# Patient Record
Sex: Female | Born: 1965 | Race: White | Hispanic: No | Marital: Married | Smoking: Former smoker
Health system: Southern US, Community
[De-identification: ages and names within clinical notes are randomized; demographics above are authoritative.]

## PROBLEM LIST (undated history)

## (undated) DIAGNOSIS — T8859XA Other complications of anesthesia, initial encounter: Secondary | ICD-10-CM

## (undated) DIAGNOSIS — L509 Urticaria, unspecified: Secondary | ICD-10-CM

## (undated) DIAGNOSIS — Z9889 Other specified postprocedural states: Secondary | ICD-10-CM

## (undated) DIAGNOSIS — F419 Anxiety disorder, unspecified: Secondary | ICD-10-CM

## (undated) DIAGNOSIS — T4145XA Adverse effect of unspecified anesthetic, initial encounter: Secondary | ICD-10-CM

## (undated) DIAGNOSIS — T7840XA Allergy, unspecified, initial encounter: Secondary | ICD-10-CM

## (undated) DIAGNOSIS — R112 Nausea with vomiting, unspecified: Secondary | ICD-10-CM

## (undated) HISTORY — PX: BIOPSY THYROID: PRO38

## (undated) HISTORY — PX: APPENDECTOMY: SHX54

## (undated) HISTORY — PX: ABDOMINAL HYSTERECTOMY: SHX81

---

## 1997-08-04 ENCOUNTER — Other Ambulatory Visit: Admission: RE | Admit: 1997-08-04 | Discharge: 1997-08-04 | Payer: Self-pay | Admitting: Obstetrics and Gynecology

## 1998-08-20 ENCOUNTER — Other Ambulatory Visit: Admission: RE | Admit: 1998-08-20 | Discharge: 1998-08-20 | Payer: Self-pay | Admitting: Obstetrics and Gynecology

## 2004-02-03 ENCOUNTER — Emergency Department (HOSPITAL_COMMUNITY): Admission: EM | Admit: 2004-02-03 | Discharge: 2004-02-03 | Payer: Self-pay | Admitting: Emergency Medicine

## 2011-01-13 ENCOUNTER — Emergency Department (HOSPITAL_COMMUNITY)
Admission: EM | Admit: 2011-01-13 | Discharge: 2011-01-13 | Disposition: A | Discharge status: Home / Self Care | Payer: Managed Care, Other (non HMO) | Attending: Emergency Medicine | Admitting: Emergency Medicine

## 2011-01-13 ENCOUNTER — Other Ambulatory Visit: Payer: Self-pay | Admitting: Obstetrics and Gynecology

## 2011-01-13 DIAGNOSIS — Z1231 Encounter for screening mammogram for malignant neoplasm of breast: Secondary | ICD-10-CM

## 2011-01-13 DIAGNOSIS — N898 Other specified noninflammatory disorders of vagina: Secondary | ICD-10-CM | POA: Insufficient documentation

## 2011-01-13 DIAGNOSIS — R109 Unspecified abdominal pain: Secondary | ICD-10-CM | POA: Insufficient documentation

## 2011-01-13 LAB — URINALYSIS, ROUTINE W REFLEX MICROSCOPIC
Bilirubin Urine: NEGATIVE
Glucose, UA: NEGATIVE mg/dL
Ketones, ur: NEGATIVE mg/dL
Leukocytes, UA: NEGATIVE
Nitrite: NEGATIVE
Protein, ur: NEGATIVE mg/dL
Specific Gravity, Urine: 1.023 (ref 1.005–1.030)
Urobilinogen, UA: 0.2 mg/dL (ref 0.0–1.0)
pH: 5.5 (ref 5.0–8.0)

## 2011-01-13 LAB — WET PREP, GENITAL
Clue Cells Wet Prep HPF POC: NONE SEEN
Trich, Wet Prep: NONE SEEN
Yeast Wet Prep HPF POC: NONE SEEN

## 2011-01-13 LAB — BASIC METABOLIC PANEL
BUN: 15 mg/dL (ref 6–23)
CO2: 25 mEq/L (ref 19–32)
Calcium: 8.8 mg/dL (ref 8.4–10.5)
Chloride: 104 mEq/L (ref 96–112)
Creatinine, Ser: 0.59 mg/dL (ref 0.50–1.10)
GFR calc Af Amer: >90 mL/min (ref >90)
GFR calc non Af Amer: >90 mL/min (ref >90)
Glucose, Bld: 107 mg/dL — ABNORMAL HIGH (ref 70–99)
Potassium: 4.1 mEq/L (ref 3.5–5.1)
Sodium: 137 mEq/L (ref 135–145)

## 2011-01-13 LAB — URINE MICROSCOPIC-ADD ON

## 2011-01-13 LAB — CBC
HCT: 35.2 % — ABNORMAL LOW (ref 36.0–46.0)
Hemoglobin: 11.7 g/dL — ABNORMAL LOW (ref 12.0–15.0)
MCH: 31.8 pg (ref 26.0–34.0)
MCHC: 33.2 g/dL (ref 30.0–36.0)
MCV: 95.7 fL (ref 78.0–100.0)
Platelets: 293 10*3/uL (ref 150–400)
RBC: 3.68 MIL/uL — ABNORMAL LOW (ref 3.87–5.11)
RDW: 13.7 % (ref 11.5–15.5)
WBC: 9.1 10*3/uL (ref 4.0–10.5)

## 2011-01-13 LAB — DIFFERENTIAL
Basophils Absolute: 0 10*3/uL (ref 0.0–0.1)
Basophils Relative: 0 % (ref 0–1)
Eosinophils Absolute: 0.1 10*3/uL (ref 0.0–0.7)
Eosinophils Relative: 1 % (ref 0–5)
Lymphocytes Relative: 15 % (ref 12–46)
Lymphs Abs: 1.3 10*3/uL (ref 0.7–4.0)
Monocytes Absolute: 0.5 10*3/uL (ref 0.1–1.0)
Monocytes Relative: 5 % (ref 3–12)
Neutro Abs: 7.2 10*3/uL (ref 1.7–7.7)
Neutrophils Relative %: 79 % — ABNORMAL HIGH (ref 43–77)

## 2011-01-13 LAB — POCT PREGNANCY, URINE: Preg Test, Ur: NEGATIVE

## 2011-01-13 LAB — OCCULT BLOOD, POC DEVICE: Fecal Occult Bld: NEGATIVE

## 2011-01-14 LAB — GC/CHLAMYDIA PROBE AMP, GENITAL
Chlamydia, DNA Probe: NEGATIVE
GC Probe Amp, Genital: NEGATIVE

## 2011-01-24 ENCOUNTER — Other Ambulatory Visit (HOSPITAL_COMMUNITY)
Admission: RE | Admit: 2011-01-24 | Discharge: 2011-01-24 | Disposition: A | Discharge status: Home / Self Care | Payer: Managed Care, Other (non HMO) | Source: Ambulatory Visit | Attending: Obstetrics and Gynecology | Admitting: Obstetrics and Gynecology

## 2011-01-24 ENCOUNTER — Other Ambulatory Visit: Payer: Self-pay | Admitting: Obstetrics and Gynecology

## 2011-01-24 DIAGNOSIS — Z01419 Encounter for gynecological examination (general) (routine) without abnormal findings: Secondary | ICD-10-CM | POA: Insufficient documentation

## 2011-01-28 ENCOUNTER — Other Ambulatory Visit: Payer: Self-pay | Admitting: Obstetrics and Gynecology

## 2011-02-07 ENCOUNTER — Ambulatory Visit: Payer: Managed Care, Other (non HMO)

## 2012-02-10 ENCOUNTER — Other Ambulatory Visit: Payer: Self-pay | Admitting: Obstetrics and Gynecology

## 2012-02-10 DIAGNOSIS — Z1231 Encounter for screening mammogram for malignant neoplasm of breast: Secondary | ICD-10-CM

## 2012-03-19 ENCOUNTER — Ambulatory Visit
Admission: RE | Admit: 2012-03-19 | Discharge: 2012-03-19 | Disposition: A | Discharge status: Home / Self Care | Payer: Managed Care, Other (non HMO) | Source: Ambulatory Visit | Attending: Obstetrics and Gynecology | Admitting: Obstetrics and Gynecology

## 2012-03-19 DIAGNOSIS — Z1231 Encounter for screening mammogram for malignant neoplasm of breast: Secondary | ICD-10-CM

## 2012-05-16 ENCOUNTER — Emergency Department (HOSPITAL_COMMUNITY): Payer: Managed Care, Other (non HMO)

## 2012-05-16 ENCOUNTER — Emergency Department (HOSPITAL_COMMUNITY)
Admission: EM | Admit: 2012-05-16 | Discharge: 2012-05-17 | Disposition: A | Discharge status: Home / Self Care | Payer: Managed Care, Other (non HMO) | Attending: Emergency Medicine | Admitting: Emergency Medicine

## 2012-05-16 ENCOUNTER — Encounter (HOSPITAL_COMMUNITY): Payer: Self-pay | Admitting: Emergency Medicine

## 2012-05-16 DIAGNOSIS — M25519 Pain in unspecified shoulder: Secondary | ICD-10-CM | POA: Insufficient documentation

## 2012-05-16 DIAGNOSIS — M549 Dorsalgia, unspecified: Secondary | ICD-10-CM | POA: Insufficient documentation

## 2012-05-16 DIAGNOSIS — R079 Chest pain, unspecified: Secondary | ICD-10-CM

## 2012-05-16 DIAGNOSIS — F17200 Nicotine dependence, unspecified, uncomplicated: Secondary | ICD-10-CM | POA: Insufficient documentation

## 2012-05-16 LAB — CBC WITH DIFFERENTIAL/PLATELET
Basophils Absolute: 0 10*3/uL (ref 0.0–0.1)
Basophils Relative: 0 % (ref 0–1)
Eosinophils Absolute: 0.2 10*3/uL (ref 0.0–0.7)
Eosinophils Relative: 2 % (ref 0–5)
HCT: 35.7 % — ABNORMAL LOW (ref 36.0–46.0)
Hemoglobin: 11.9 g/dL — ABNORMAL LOW (ref 12.0–15.0)
Lymphocytes Relative: 28 % (ref 12–46)
Lymphs Abs: 2.1 10*3/uL (ref 0.7–4.0)
MCH: 31.6 pg (ref 26.0–34.0)
MCHC: 33.3 g/dL (ref 30.0–36.0)
MCV: 94.7 fL (ref 78.0–100.0)
Monocytes Absolute: 0.4 10*3/uL (ref 0.1–1.0)
Monocytes Relative: 5 % (ref 3–12)
Neutro Abs: 4.9 10*3/uL (ref 1.7–7.7)
Neutrophils Relative %: 65 % (ref 43–77)
Platelets: 251 10*3/uL (ref 150–400)
RBC: 3.77 MIL/uL — ABNORMAL LOW (ref 3.87–5.11)
RDW: 13.7 % (ref 11.5–15.5)
WBC: 7.6 10*3/uL (ref 4.0–10.5)

## 2012-05-16 LAB — BASIC METABOLIC PANEL
BUN: 24 mg/dL — ABNORMAL HIGH (ref 6–23)
CO2: 24 mEq/L (ref 19–32)
Calcium: 9.2 mg/dL (ref 8.4–10.5)
Chloride: 102 mEq/L (ref 96–112)
Creatinine, Ser: 0.97 mg/dL (ref 0.50–1.10)
GFR calc Af Amer: 78 mL/min — ABNORMAL LOW (ref >90)
GFR calc non Af Amer: 67 mL/min — ABNORMAL LOW (ref >90)
Glucose, Bld: 114 mg/dL — ABNORMAL HIGH (ref 70–99)
Potassium: 3.7 mEq/L (ref 3.5–5.1)
Sodium: 138 mEq/L (ref 135–145)

## 2012-05-16 LAB — TROPONIN I: Troponin I: <0.3 ng/mL (ref <0.30)

## 2012-05-16 MED ORDER — TRAMADOL HCL 50 MG PO TABS: 50.0000 mg | ORAL_TABLET | Freq: Four times a day (QID) | ORAL | Status: DC | PRN

## 2012-05-16 MED ORDER — IBUPROFEN 800 MG PO TABS: 800.0000 mg | ORAL_TABLET | Freq: Three times a day (TID) | ORAL | Status: DC

## 2012-05-16 NOTE — ED Notes (Signed)
Pt states that she began having pain in her chest on Friday which has gotten progressively worse. Pt describes the pain as squeezing and pressure. Pt says she had diaphoresis and nausea but denies weakness, sob, v, d, or lightheadedness. Pt in NAD, skin pwd.

## 2012-05-16 NOTE — ED Provider Notes (Signed)
History     CSN: [id number]  Arrival date & time 05/16/12  2133   First MD Initiated Contact with Patient 05/16/12 2150      Chief Complaint  Patient presents with  . Chest Pain    (Consider location/radiation/quality/duration/timing/severity/associated sxs/prior treatment) HPI Comments: Patient comes to the ER for evaluation of chest and left shoulder and back pain which began 2 nights ago. Patient reports that the pain has been continuous since it started, but has slowly gotten worse. Pain is not related to exertion. If she raises her arm the pain gets better. It is related to certain positions. She is not short of breath. There is no increase in pain with breathing. She denies injury to the area. She denies family history of heart disease.  Patient is a 47 y.o. female presenting with chest pain.  Chest Pain Pertinent negatives for primary symptoms include no shortness of breath.     History reviewed. No pertinent past medical history.  Past Surgical History  Procedure Date  . Abdominal hysterectomy   . Appendectomy     History reviewed. No pertinent family history.  History  Substance Use Topics  . Smoking status: Current Every Day Smoker -- 0.5 packs/day for 34 years    Types: Cigarettes  . Smokeless tobacco: Never Used  . Alcohol Use: 3.0 oz/week    5 Glasses of wine per week    OB History    Grav Para Term Preterm Abortions TAB SAB Ect Mult Living                  Review of Systems  Respiratory: Negative for shortness of breath.   Cardiovascular: Positive for chest pain.  All other systems reviewed and are negative.    Allergies  Sulfa antibiotics  Home Medications  No current outpatient prescriptions on file.  BP 150/95  Pulse 93  Temp 97.8 F (36.6 C) (Oral)  Resp 20  Wt 179 lb (81.194 kg)  SpO2 98%  Physical Exam  Constitutional: She is oriented to person, place, and time. She appears well-developed and well-nourished. No distress.  HENT:   Head: Normocephalic and atraumatic.  Right Ear: Hearing normal.  Nose: Nose normal.  Mouth/Throat: Oropharynx is clear and moist and mucous membranes are normal.  Eyes: Conjunctivae normal and EOM are normal. Pupils are equal, round, and reactive to light.  Neck: Normal range of motion. Neck supple.  Cardiovascular: Normal rate, regular rhythm, S1 normal and S2 normal.  Exam reveals no gallop and no friction rub.   No murmur heard. Pulmonary/Chest: Effort normal and breath sounds normal. No respiratory distress. She exhibits tenderness.    Abdominal: Soft. Normal appearance and bowel sounds are normal. There is no hepatosplenomegaly. There is no tenderness. There is no rebound, no guarding, no tenderness at McBurney's point and negative Murphy's sign. No hernia.  Musculoskeletal: Normal range of motion.       Left shoulder: She exhibits tenderness and pain. She exhibits normal range of motion and no deformity.       Arms: Neurological: She is alert and oriented to person, place, and time. She has normal strength. No cranial nerve deficit or sensory deficit. Coordination normal. GCS eye subscore is 4. GCS verbal subscore is 5. GCS motor subscore is 6.  Skin: Skin is warm, dry and intact. No rash noted. No cyanosis.  Psychiatric: She has a normal mood and affect. Her speech is normal and behavior is normal. Thought content normal.    ED  Course  Procedures (including critical care time)   Date: 05/16/2012  Rate: 78  Rhythm: normal sinus rhythm  QRS Axis: normal  Intervals: normal  ST/T Wave abnormalities: normal  Conduction Disutrbances: none  Narrative Interpretation: unremarkable      Labs Reviewed  CBC WITH DIFFERENTIAL - Abnormal; Notable for the following:    RBC 3.77 (*)     Hemoglobin 11.9 (*)     HCT 35.7 (*)     All other components within normal limits  BASIC METABOLIC PANEL  TROPONIN I   Dg Chest 2 View  05/16/2012  *RADIOLOGY REPORT*  Clinical Data: Chest pain  radiating to the left shoulder blade.  CHEST - 2 VIEW  Comparison: 02/03/2004  Findings: Shallow inspiration with mild vascular crowding in the lower lungs.  No focal consolidation or airspace disease.  No blunting of costophrenic angles.  No pneumothorax.  Mediastinal contours appear intact.  Normal heart size and pulmonary vascularity.  Stable appearance since previous study.  IMPRESSION: No evidence of active pulmonary disease.   Original Report Authenticated By: Burman Nieves, M.D.      Diagnosis: Chest wall pain    MDM  Presents to the ER with complaints of continuous left-sided chest and back pain for 2 days. Cardiorespiratory symptoms. Pain has worsened since its onset, but is not related to exertion. Patient denies any family history of heart disease, but is a smoker and has had a hysterectomy. Pain is very atypical. Pain is reproducible with palpation to the chest wall and improves with raising her arm up. This is most consistent with chest wall pain. Patient has had continuous pain for 2 days and has a normal EKG and troponin. She is here for further workup as an outpatient. Will treat for musculoskeletal chest pain.        Gilda Crease, MD 05/16/12 2330

## 2012-11-18 ENCOUNTER — Other Ambulatory Visit: Payer: Self-pay

## 2012-11-18 DIAGNOSIS — Z1231 Encounter for screening mammogram for malignant neoplasm of breast: Secondary | ICD-10-CM

## 2013-03-21 ENCOUNTER — Ambulatory Visit: Payer: Managed Care, Other (non HMO)

## 2013-04-20 ENCOUNTER — Ambulatory Visit
Admission: RE | Admit: 2013-04-20 | Discharge: 2013-04-20 | Disposition: A | Discharge status: Home / Self Care | Payer: Managed Care, Other (non HMO) | Source: Ambulatory Visit

## 2013-04-20 DIAGNOSIS — Z1231 Encounter for screening mammogram for malignant neoplasm of breast: Secondary | ICD-10-CM

## 2014-03-20 ENCOUNTER — Other Ambulatory Visit: Payer: Self-pay

## 2014-03-20 DIAGNOSIS — Z1231 Encounter for screening mammogram for malignant neoplasm of breast: Secondary | ICD-10-CM

## 2014-04-28 ENCOUNTER — Ambulatory Visit
Admission: RE | Admit: 2014-04-28 | Discharge: 2014-04-28 | Disposition: A | Discharge status: Home / Self Care | Payer: Managed Care, Other (non HMO) | Source: Ambulatory Visit

## 2014-04-28 DIAGNOSIS — Z1231 Encounter for screening mammogram for malignant neoplasm of breast: Secondary | ICD-10-CM

## 2014-08-01 ENCOUNTER — Other Ambulatory Visit (HOSPITAL_COMMUNITY): Payer: Self-pay | Admitting: Physician Assistant

## 2014-08-01 DIAGNOSIS — R7989 Other specified abnormal findings of blood chemistry: Secondary | ICD-10-CM

## 2014-08-15 ENCOUNTER — Encounter (HOSPITAL_COMMUNITY): Payer: Managed Care, Other (non HMO)

## 2014-08-16 ENCOUNTER — Encounter (HOSPITAL_COMMUNITY): Payer: Managed Care, Other (non HMO)

## 2014-08-17 ENCOUNTER — Encounter (HOSPITAL_COMMUNITY)
Admission: RE | Admit: 2014-08-17 | Discharge: 2014-08-17 | Disposition: A | Discharge status: Home / Self Care | Payer: Managed Care, Other (non HMO) | Source: Ambulatory Visit | Attending: Physician Assistant | Admitting: Physician Assistant

## 2014-08-17 DIAGNOSIS — R7989 Other specified abnormal findings of blood chemistry: Secondary | ICD-10-CM

## 2014-08-17 DIAGNOSIS — R946 Abnormal results of thyroid function studies: Secondary | ICD-10-CM | POA: Insufficient documentation

## 2014-08-17 MED ORDER — SODIUM IODIDE I 131 CAPSULE
6.6000 | Freq: Once | INTRAVENOUS | Status: AC | PRN
  Administered 2014-08-17: 6.6 via ORAL

## 2014-08-18 ENCOUNTER — Encounter (HOSPITAL_COMMUNITY)
Admission: RE | Admit: 2014-08-18 | Discharge: 2014-08-18 | Disposition: A | Discharge status: Home / Self Care | Payer: Managed Care, Other (non HMO) | Source: Ambulatory Visit | Attending: Physician Assistant | Admitting: Physician Assistant

## 2014-08-18 DIAGNOSIS — R946 Abnormal results of thyroid function studies: Secondary | ICD-10-CM | POA: Insufficient documentation

## 2014-08-18 MED ORDER — SODIUM IODIDE I 131 CAPSULE: 6.6000 | Freq: Once | INTRAVENOUS | Status: AC | PRN

## 2014-08-18 MED ORDER — SODIUM PERTECHNETATE TC 99M INJECTION
10.0000 | Freq: Once | INTRAVENOUS | Status: AC | PRN
  Administered 2014-08-18: 10 via INTRAVENOUS

## 2014-08-21 ENCOUNTER — Other Ambulatory Visit: Payer: Self-pay | Admitting: Physician Assistant

## 2014-08-21 DIAGNOSIS — R9389 Abnormal findings on diagnostic imaging of other specified body structures: Secondary | ICD-10-CM

## 2014-08-22 ENCOUNTER — Ambulatory Visit
Admission: RE | Admit: 2014-08-22 | Discharge: 2014-08-22 | Disposition: A | Discharge status: Home / Self Care | Payer: Managed Care, Other (non HMO) | Source: Ambulatory Visit | Attending: Physician Assistant | Admitting: Physician Assistant

## 2014-08-22 DIAGNOSIS — R9389 Abnormal findings on diagnostic imaging of other specified body structures: Secondary | ICD-10-CM

## 2014-09-26 ENCOUNTER — Other Ambulatory Visit: Payer: Self-pay | Admitting: Endocrinology

## 2014-09-26 DIAGNOSIS — E041 Nontoxic single thyroid nodule: Secondary | ICD-10-CM

## 2014-10-24 ENCOUNTER — Ambulatory Visit
Admission: RE | Admit: 2014-10-24 | Discharge: 2014-10-24 | Disposition: A | Discharge status: Home / Self Care | Payer: Managed Care, Other (non HMO) | Source: Ambulatory Visit | Attending: Endocrinology | Admitting: Endocrinology

## 2014-10-24 ENCOUNTER — Other Ambulatory Visit (HOSPITAL_COMMUNITY)
Admission: RE | Admit: 2014-10-24 | Discharge: 2014-10-24 | Disposition: A | Discharge status: Home / Self Care | Payer: Managed Care, Other (non HMO) | Source: Ambulatory Visit | Attending: Endocrinology | Admitting: Endocrinology

## 2014-10-24 DIAGNOSIS — E041 Nontoxic single thyroid nodule: Secondary | ICD-10-CM

## 2015-03-12 ENCOUNTER — Ambulatory Visit: Payer: Self-pay | Admitting: Surgery

## 2015-03-12 NOTE — H&P (Signed)
Hannah Chapman 03/12/2015 10:44 AM Location: [facility] Patient #: [id number] DOB: 1965-11-02 Married / Language: [place] / Race: White Female  History of Present Illness Hannah Fus A. Johnda Billiot MD; 03/12/2015 11:12 AM) Patient words: Patient sent at the request of Noelle Redmon for bulge of abdominal wall 2 months. Patient has noticed a bulge in her lower abdominal wall just above her hysterectomy scar site for 2 months. It's more noticeable when she stands. Not causing any significant pain at this point. She does have chronic constipation she states. No nausea or vomiting. A CT scan was obtained which shows a 6 cm lower abdominal wall hernia and a second 1.8 cm periumbilical hernia.  The patient is a 49 year old female.   Other Problems Fay Records, CMA; 03/12/2015 10:44 AM) Anxiety Disorder Heart murmur Thyroid Disease  Past Surgical History Fay Records, CMA; 03/12/2015 10:44 AM) Appendectomy Hysterectomy (not due to cancer) - Complete  Diagnostic Studies History Fay Records, CMA; 03/12/2015 10:44 AM) Colonoscopy never Mammogram within last year Pap Smear >5 years ago  Allergies Fay Records, CMA; 03/12/2015 10:45 AM) Sulfa 10 *OPHTHALMIC AGENTS* Rash.  Medication History Fay Records, CMA; 03/12/2015 10:45 AM) Premarin (0.625MG  Tablet, Oral) Active. Ibuprofen (  Tablet, Oral) Active. Medications Reconciled  Pregnancy / Birth History Fay Records, CMA; 03/12/2015 10:44 AM) Age at menarche 11 years. Age of menopause 42-50 Gravida 3 Maternal age 31-20 Para 3     Review of Systems Fay Records CMA; 03/12/2015 10:44 AM) General Present- Fatigue and Weight Gain. Not Present- Appetite Loss, Chills, Fever, Night Sweats and Weight Loss. Skin Present- Dryness. Not Present- Change in Wart/Mole, Hives, Jaundice, New Lesions, Non-Healing Wounds, Rash and Ulcer. HEENT Not Present- Earache, Hearing Loss, Hoarseness, Nose Bleed, Oral  Ulcers, Ringing in the Ears, Seasonal Allergies, Sinus Pain, Sore Throat, Visual Disturbances, Wears glasses/contact lenses and Yellow Eyes. Respiratory Present- Snoring. Not Present- Bloody sputum, Chronic Cough, Difficulty Breathing and Wheezing. Breast Not Present- Breast Mass, Breast Pain, Nipple Discharge and Skin Changes. Cardiovascular Not Present- Chest Pain, Difficulty Breathing Lying Down, Leg Cramps, Palpitations, Rapid Heart Rate, Shortness of Breath and Swelling of Extremities. Gastrointestinal Present- Bloating and Constipation. Not Present- Abdominal Pain, Bloody Stool, Change in Bowel Habits, Chronic diarrhea, Difficulty Swallowing, Excessive gas, Gets full quickly at meals, Hemorrhoids, Indigestion, Nausea, Rectal Pain and Vomiting. Female Genitourinary Present- Frequency. Not Present- Nocturia, Painful Urination, Pelvic Pain and Urgency. Musculoskeletal Present- Back Pain. Not Present- Joint Pain, Joint Stiffness, Muscle Pain, Muscle Weakness and Swelling of Extremities. Neurological Not Present- Decreased Memory, Fainting, Headaches, Numbness, Seizures, Tingling, Tremor, Trouble walking and Weakness. Psychiatric Present- Anxiety. Not Present- Bipolar, Change in Sleep Pattern, Depression, Fearful and Frequent crying. Endocrine Present- Hot flashes. Not Present- Cold Intolerance, Excessive Hunger, Hair Changes, Heat Intolerance and New Diabetes. Hematology Not Present- Easy Bruising, Excessive bleeding, Gland problems, HIV and Persistent Infections.  Vitals Fay Records CMA; 03/12/2015 10:45 AM) 03/12/2015 10:45 AM Weight: 192 lb Height: 60in Body Surface Area: 1.83 m Body Mass Index: 37.5 kg/m  Temp.: 97.35F(Temporal)  Pulse: 117 (Regular)  BP: 126/74 (Sitting, Left Arm, Standard)      Physical Exam (Leng Montesdeoca A. Teila Skalsky MD; 03/12/2015 11:13 AM)  General Mental Status-Alert. General Appearance-Consistent with stated age. Hydration-Well  hydrated. Voice-Normal.  Head and Neck Head-normocephalic, atraumatic with no lesions or palpable masses. Trachea-midline. Thyroid Gland Characteristics - normal size and consistency.  Chest and Lung Exam Chest and lung exam reveals -quiet, even and easy respiratory effort with no use of accessory muscles  and on auscultation, normal breath sounds, no adventitious sounds and normal vocal resonance. Inspection Chest Wall - Normal. Back - normal.  Abdomen Note: 6 cm right lower quadrant reducible hernia. Soft nontender. Nondistended.     Assessment & Plan (Aylan Bayona A. Diara Chaudhari MD; 03/12/2015 11:10 AM)  INCISIONAL HERNIA, WITHOUT OBSTRUCTION OR GANGRENE (K43.2) Impression: Discussed laparoscopic and open hernia repair with mesh. The pros and cons of each discussed. The risks and benefits of each discussed. She would like to proceed with surgical repair using a laparoscopic technique.   The risk of hernia repair include bleeding, infection, organ injury, bowel injury, bladder injury, nerve injury recurrent hernia, blood clots, worsening of underlying condition, chronic pain, mesh use, open surgery, death, and the need for other operattions. Pt agrees to proceed  Current Plans You are being scheduled for surgery - Our schedulers will call you.  You should hear from our office's scheduling department within 5 working days about the location, date, and time of surgery. We try to make accommodations for patient's preferences in scheduling surgery, but sometimes the OR schedule or the surgeon's schedule prevents us from making those accommodations.  If you have not heard from our office [phone] in 5 working days, call the office and ask for your surgeon's nurse.  If you have other questions about your diagnosis, plan, or surgery, call the office and ask for your surgeon's nurse.  The anatomy & physiology of the abdominal wall was discussed. The pathophysiology of hernias was  discussed. Natural history risks without surgery including progeressive enlargement, pain, incarceration, & strangulation was discussed. Contributors to complications such as smoking, obesity, diabetes, prior surgery, etc were discussed.  I feel the risks of no intervention will lead to serious problems that outweigh the operative risks; therefore, I recommended surgery to reduce and repair the hernia. I explained laparoscopic techniques with possible need for an open approach. I noted the probable use of mesh to patch and/or buttress the hernia repair  Risks such as bleeding, infection, abscess, need for further treatment, heart attack, death, and other risks were discussed. I noted a good likelihood this will help address the problem. Goals of post-operative recovery were discussed as well. Possibility that this will not correct all symptoms was explained. I stressed the importance of low-impact activity, aggressive pain control, avoiding constipation, & not pushing through pain to minimize risk of post-operative chronic pain or injury. Possibility of reherniation especially with smoking, obesity, diabetes, immunosuppression, and other health conditions was discussed. We will work to minimize complications.  An educational handout further explaining the pathology & treatment options was given as well. Questions were answered. The patient expresses understanding & wishes to proceed with surgery.  Pt Education - [facility] Hernia Post-Op HCI (Gross): discussed with patient and provided information. Pt Education - [facility] Pain Control (Gross)

## 2015-04-13 ENCOUNTER — Other Ambulatory Visit: Payer: Self-pay

## 2015-04-13 DIAGNOSIS — Z1231 Encounter for screening mammogram for malignant neoplasm of breast: Secondary | ICD-10-CM

## 2015-04-23 ENCOUNTER — Encounter (HOSPITAL_COMMUNITY): Payer: Self-pay

## 2015-04-23 ENCOUNTER — Encounter (HOSPITAL_COMMUNITY)
Admission: RE | Admit: 2015-04-23 | Discharge: 2015-04-23 | Disposition: A | Discharge status: Home / Self Care | Payer: Managed Care, Other (non HMO) | Source: Ambulatory Visit | Attending: Surgery | Admitting: Surgery

## 2015-04-23 HISTORY — DX: Anxiety disorder, unspecified: F41.9

## 2015-04-23 LAB — COMPREHENSIVE METABOLIC PANEL
ALT: 16 U/L (ref 14–54)
AST: 16 U/L (ref 15–41)
Albumin: 3.8 g/dL (ref 3.5–5.0)
Alkaline Phosphatase: 80 U/L (ref 38–126)
Anion gap: 8 (ref 5–15)
BUN: 10 mg/dL (ref 6–20)
CO2: 24 mmol/L (ref 22–32)
Calcium: 9.4 mg/dL (ref 8.9–10.3)
Chloride: 109 mmol/L (ref 101–111)
Creatinine, Ser: 0.58 mg/dL (ref 0.44–1.00)
GFR calc Af Amer: >60 mL/min (ref >60)
GFR calc non Af Amer: >60 mL/min (ref >60)
Glucose, Bld: 104 mg/dL — ABNORMAL HIGH (ref 65–99)
Potassium: 4.2 mmol/L (ref 3.5–5.1)
Sodium: 141 mmol/L (ref 135–145)
Total Bilirubin: 0.1 mg/dL — ABNORMAL LOW (ref 0.3–1.2)
Total Protein: 6.8 g/dL (ref 6.5–8.1)

## 2015-04-23 LAB — CBC WITH DIFFERENTIAL/PLATELET
Basophils Absolute: 0 10*3/uL (ref 0.0–0.1)
Basophils Relative: 0 %
Eosinophils Absolute: 0.1 10*3/uL (ref 0.0–0.7)
Eosinophils Relative: 1 %
HCT: 39 % (ref 36.0–46.0)
Hemoglobin: 12.9 g/dL (ref 12.0–15.0)
Lymphocytes Relative: 23 %
Lymphs Abs: 1.9 10*3/uL (ref 0.7–4.0)
MCH: 30.8 pg (ref 26.0–34.0)
MCHC: 33.1 g/dL (ref 30.0–36.0)
MCV: 93.1 fL (ref 78.0–100.0)
Monocytes Absolute: 0.6 10*3/uL (ref 0.1–1.0)
Monocytes Relative: 7 %
Neutro Abs: 5.7 10*3/uL (ref 1.7–7.7)
Neutrophils Relative %: 69 %
Platelets: 284 10*3/uL (ref 150–400)
RBC: 4.19 MIL/uL (ref 3.87–5.11)
RDW: 13.3 % (ref 11.5–15.5)
WBC: 8.3 10*3/uL (ref 4.0–10.5)

## 2015-04-23 NOTE — Pre-Procedure Instructions (Signed)
    Cletus GashLeslie Obie  04/23/2015      [id number] [place]- [place], [place] - [address] [place] [address] Phone: [phone] Fax: [phone]  [facility] [place] - [id number] [place] [address] Phone: [phone] Fax: [phone]    Your procedure is scheduled on 04/26/15.  [facility] at [facility]30 A.M.  Call this number if you have problems the morning of surgery:  [phone]   Remember:  Do not eat food or drink liquids after midnight.  Take these medicines the morning of surgery with A SIP OF WATER --premarin,ultram   Do not wear jewelry, make-up or nail polish.  Do not wear lotions, powders, or perfumes.  You may wear deodorant.  Do not shave 48 hours prior to surgery.  Men may shave face and neck.  Do not bring valuables to the hospital.  [facility] is not responsible for any belongings or valuables.  Contacts, dentures or bridgework may not be worn into surgery.  Leave your suitcase in the car.  After surgery it may be brought to your room.  For patients admitted to the hospital, discharge time will be determined by your treatment team.  Patients discharged the day of surgery will not be allowed to drive home.   Name and phone number of your driver:   Special instructions:    Please read over the following fact sheets that you were given. Pain Booklet, Coughing and Deep Breathing and Surgical Site Infection Prevention

## 2015-04-26 ENCOUNTER — Inpatient Hospital Stay (HOSPITAL_COMMUNITY): Payer: Managed Care, Other (non HMO) | Admitting: Certified Registered Nurse Anesthetist

## 2015-04-26 ENCOUNTER — Encounter (HOSPITAL_COMMUNITY): Payer: Self-pay | Admitting: Certified Registered Nurse Anesthetist

## 2015-04-26 ENCOUNTER — Inpatient Hospital Stay (HOSPITAL_COMMUNITY)
Admission: AD | Admit: 2015-04-26 | Discharge: 2015-04-27 | DRG: 355 | Disposition: A | Discharge status: Home / Self Care | Payer: Managed Care, Other (non HMO) | Source: Ambulatory Visit | Attending: Surgery | Admitting: Surgery

## 2015-04-26 ENCOUNTER — Encounter (HOSPITAL_COMMUNITY)
Admission: AD | Disposition: A | Discharge status: Home / Self Care | Payer: Self-pay | Source: Ambulatory Visit | Attending: Surgery

## 2015-04-26 DIAGNOSIS — K432 Incisional hernia without obstruction or gangrene: Principal | ICD-10-CM | POA: Diagnosis present

## 2015-04-26 DIAGNOSIS — Z6837 Body mass index (BMI) 37.0-37.9, adult: Secondary | ICD-10-CM

## 2015-04-26 DIAGNOSIS — E079 Disorder of thyroid, unspecified: Secondary | ICD-10-CM | POA: Diagnosis present

## 2015-04-26 DIAGNOSIS — F419 Anxiety disorder, unspecified: Secondary | ICD-10-CM | POA: Diagnosis present

## 2015-04-26 DIAGNOSIS — R112 Nausea with vomiting, unspecified: Secondary | ICD-10-CM | POA: Diagnosis not present

## 2015-04-26 DIAGNOSIS — F17210 Nicotine dependence, cigarettes, uncomplicated: Secondary | ICD-10-CM | POA: Diagnosis present

## 2015-04-26 HISTORY — DX: Other complications of anesthesia, initial encounter: T88.59XA

## 2015-04-26 HISTORY — PX: INCISIONAL HERNIA REPAIR: SHX193

## 2015-04-26 HISTORY — DX: Adverse effect of unspecified anesthetic, initial encounter: T41.45XA

## 2015-04-26 HISTORY — DX: Nausea with vomiting, unspecified: R11.2

## 2015-04-26 HISTORY — PX: INSERTION OF MESH: SHX5868

## 2015-04-26 HISTORY — DX: Other specified postprocedural states: Z98.890

## 2015-04-26 SURGERY — REPAIR, HERNIA, INCISIONAL, LAPAROSCOPIC
Anesthesia: General | Site: Abdomen

## 2015-04-26 MED ORDER — FENTANYL CITRATE (PF) 100 MCG/2ML IJ SOLN
INTRAMUSCULAR | Status: DC | PRN
  Administered 2015-04-26 (×5): 50 ug via INTRAVENOUS
  Administered 2015-04-26: 100 ug via INTRAVENOUS
  Administered 2015-04-26: 50 ug via INTRAVENOUS

## 2015-04-26 MED ORDER — PHENYLEPHRINE HCL 10 MG/ML IJ SOLN
INTRAMUSCULAR | Status: DC | PRN
  Administered 2015-04-26 (×4): 80 ug via INTRAVENOUS
  Administered 2015-04-26: 40 ug via INTRAVENOUS

## 2015-04-26 MED ORDER — FENTANYL CITRATE (PF) 250 MCG/5ML IJ SOLN
INTRAMUSCULAR | Status: AC
  Filled 2015-04-26: qty 5

## 2015-04-26 MED ORDER — HYDROMORPHONE HCL 1 MG/ML IJ SOLN
INTRAMUSCULAR | Status: AC
  Filled 2015-04-26: qty 1

## 2015-04-26 MED ORDER — CHLORHEXIDINE GLUCONATE 4 % EX LIQD: 1.0000 "application " | Freq: Once | CUTANEOUS | Status: DC

## 2015-04-26 MED ORDER — DIPHENHYDRAMINE HCL 50 MG/ML IJ SOLN: 12.5000 mg | Freq: Four times a day (QID) | INTRAMUSCULAR | Status: DC | PRN

## 2015-04-26 MED ORDER — SUGAMMADEX SODIUM 200 MG/2ML IV SOLN
INTRAVENOUS | Status: DC | PRN
  Administered 2015-04-26: 200 mg via INTRAVENOUS

## 2015-04-26 MED ORDER — CEFAZOLIN SODIUM-DEXTROSE 2-3 GM-% IV SOLR
2.0000 g | Freq: Three times a day (TID) | INTRAVENOUS | Status: AC
  Administered 2015-04-26: 2 g via INTRAVENOUS
  Filled 2015-04-26: qty 50

## 2015-04-26 MED ORDER — BUPIVACAINE-EPINEPHRINE 0.25% -1:200000 IJ SOLN
INTRAMUSCULAR | Status: DC | PRN
Start: 2015-04-26 — End: 2015-04-26
  Administered 2015-04-26: 10 mL

## 2015-04-26 MED ORDER — ROCURONIUM BROMIDE 50 MG/5ML IV SOLN
INTRAVENOUS | Status: AC
  Filled 2015-04-26: qty 1

## 2015-04-26 MED ORDER — 0.9 % SODIUM CHLORIDE (POUR BTL) OPTIME
TOPICAL | Status: DC | PRN
Start: 2015-04-26 — End: 2015-04-26
  Administered 2015-04-26: 1000 mL

## 2015-04-26 MED ORDER — CEFAZOLIN SODIUM-DEXTROSE 2-3 GM-% IV SOLR
INTRAVENOUS | Status: AC
  Filled 2015-04-26: qty 50

## 2015-04-26 MED ORDER — OXYCODONE HCL 5 MG PO TABS
ORAL_TABLET | ORAL | Status: AC
  Filled 2015-04-26: qty 1

## 2015-04-26 MED ORDER — LIDOCAINE HCL (CARDIAC) 20 MG/ML IV SOLN
INTRAVENOUS | Status: AC
  Filled 2015-04-26: qty 5

## 2015-04-26 MED ORDER — METHOCARBAMOL 500 MG PO TABS
500.0000 mg | ORAL_TABLET | Freq: Four times a day (QID) | ORAL | Status: DC | PRN
  Administered 2015-04-26: 500 mg via ORAL
  Filled 2015-04-26: qty 1

## 2015-04-26 MED ORDER — LACTATED RINGERS IV SOLN
INTRAVENOUS | Status: DC
  Administered 2015-04-26: 50 mL/h via INTRAVENOUS

## 2015-04-26 MED ORDER — OXYCODONE HCL 5 MG PO TABS
5.0000 mg | ORAL_TABLET | ORAL | Status: DC | PRN
  Administered 2015-04-26 (×2): 5 mg via ORAL
  Administered 2015-04-26 – 2015-04-27 (×2): 10 mg via ORAL
  Filled 2015-04-26: qty 1
  Filled 2015-04-26 (×2): qty 2

## 2015-04-26 MED ORDER — LACTATED RINGERS IV SOLN
INTRAVENOUS | Status: DC | PRN
  Administered 2015-04-26 (×2): via INTRAVENOUS

## 2015-04-26 MED ORDER — PROPOFOL 10 MG/ML IV BOLUS
INTRAVENOUS | Status: AC
  Filled 2015-04-26: qty 20

## 2015-04-26 MED ORDER — PHENYLEPHRINE 40 MCG/ML (10ML) SYRINGE FOR IV PUSH (FOR BLOOD PRESSURE SUPPORT)
PREFILLED_SYRINGE | INTRAVENOUS | Status: AC
  Filled 2015-04-26: qty 10

## 2015-04-26 MED ORDER — ONDANSETRON HCL 4 MG/2ML IJ SOLN
4.0000 mg | Freq: Four times a day (QID) | INTRAMUSCULAR | Status: DC | PRN
  Administered 2015-04-26: 4 mg via INTRAVENOUS
  Filled 2015-04-26: qty 2

## 2015-04-26 MED ORDER — SIMETHICONE 80 MG PO CHEW: 40.0000 mg | CHEWABLE_TABLET | Freq: Four times a day (QID) | ORAL | Status: DC | PRN

## 2015-04-26 MED ORDER — HYDROMORPHONE HCL 1 MG/ML IJ SOLN
1.0000 mg | INTRAMUSCULAR | Status: DC | PRN
  Administered 2015-04-26: 1 mg via INTRAVENOUS
  Filled 2015-04-26: qty 1

## 2015-04-26 MED ORDER — CEFAZOLIN SODIUM-DEXTROSE 2-3 GM-% IV SOLR
2.0000 g | INTRAVENOUS | Status: AC
  Administered 2015-04-26: 2 g via INTRAVENOUS

## 2015-04-26 MED ORDER — BUPIVACAINE-EPINEPHRINE (PF) 0.25% -1:200000 IJ SOLN
INTRAMUSCULAR | Status: AC
  Filled 2015-04-26: qty 30

## 2015-04-26 MED ORDER — ONDANSETRON 4 MG PO TBDP: 4.0000 mg | ORAL_TABLET | Freq: Four times a day (QID) | ORAL | Status: DC | PRN

## 2015-04-26 MED ORDER — DIPHENHYDRAMINE HCL 12.5 MG/5ML PO ELIX: 12.5000 mg | ORAL_SOLUTION | Freq: Four times a day (QID) | ORAL | Status: DC | PRN

## 2015-04-26 MED ORDER — MIDAZOLAM HCL 2 MG/2ML IJ SOLN
INTRAMUSCULAR | Status: AC
  Filled 2015-04-26: qty 2

## 2015-04-26 MED ORDER — PROPOFOL 10 MG/ML IV BOLUS
INTRAVENOUS | Status: DC | PRN
  Administered 2015-04-26: 150 mg via INTRAVENOUS
  Administered 2015-04-26: 20 mg via INTRAVENOUS

## 2015-04-26 MED ORDER — PROMETHAZINE HCL 25 MG/ML IJ SOLN
12.5000 mg | INTRAMUSCULAR | Status: DC | PRN
  Administered 2015-04-26 – 2015-04-27 (×3): 12.5 mg via INTRAVENOUS
  Filled 2015-04-26 (×3): qty 1

## 2015-04-26 MED ORDER — ZOLPIDEM TARTRATE 5 MG PO TABS: 5.0000 mg | ORAL_TABLET | Freq: Every evening | ORAL | Status: DC | PRN

## 2015-04-26 MED ORDER — ONDANSETRON HCL 4 MG/2ML IJ SOLN: 4.0000 mg | Freq: Once | INTRAMUSCULAR | Status: DC | PRN

## 2015-04-26 MED ORDER — ACETAMINOPHEN 325 MG PO TABS
650.0000 mg | ORAL_TABLET | Freq: Four times a day (QID) | ORAL | Status: DC | PRN
  Administered 2015-04-27: 650 mg via ORAL
  Filled 2015-04-26: qty 2

## 2015-04-26 MED ORDER — MIDAZOLAM HCL 5 MG/5ML IJ SOLN
INTRAMUSCULAR | Status: DC | PRN
  Administered 2015-04-26: 2 mg via INTRAVENOUS

## 2015-04-26 MED ORDER — KCL IN DEXTROSE-NACL 20-5-0.9 MEQ/L-%-% IV SOLN
INTRAVENOUS | Status: DC
  Administered 2015-04-26 – 2015-04-27 (×2): via INTRAVENOUS
  Filled 2015-04-26 (×4): qty 1000

## 2015-04-26 MED ORDER — ENOXAPARIN SODIUM 40 MG/0.4ML ~~LOC~~ SOLN
40.0000 mg | SUBCUTANEOUS | Status: DC
Start: 2015-04-27 — End: 2015-04-27
  Administered 2015-04-27: 40 mg via SUBCUTANEOUS
  Filled 2015-04-26: qty 0.4

## 2015-04-26 MED ORDER — HYDROMORPHONE HCL 1 MG/ML IJ SOLN
0.2500 mg | INTRAMUSCULAR | Status: DC | PRN
  Administered 2015-04-26 (×4): 0.5 mg via INTRAVENOUS

## 2015-04-26 MED ORDER — HYDRALAZINE HCL 20 MG/ML IJ SOLN: 10.0000 mg | INTRAMUSCULAR | Status: DC | PRN

## 2015-04-26 MED ORDER — ONDANSETRON HCL 4 MG/2ML IJ SOLN
INTRAMUSCULAR | Status: AC
  Filled 2015-04-26: qty 2

## 2015-04-26 MED ORDER — LIDOCAINE HCL (CARDIAC) 20 MG/ML IV SOLN
INTRAVENOUS | Status: DC | PRN
  Administered 2015-04-26: 100 mg via INTRAVENOUS

## 2015-04-26 MED ORDER — MEPERIDINE HCL 25 MG/ML IJ SOLN: 6.2500 mg | INTRAMUSCULAR | Status: DC | PRN

## 2015-04-26 MED ORDER — ACETAMINOPHEN 650 MG RE SUPP: 650.0000 mg | Freq: Four times a day (QID) | RECTAL | Status: DC | PRN

## 2015-04-26 MED ORDER — ROCURONIUM BROMIDE 100 MG/10ML IV SOLN
INTRAVENOUS | Status: DC | PRN
  Administered 2015-04-26: 5 mg via INTRAVENOUS
  Administered 2015-04-26: 10 mg via INTRAVENOUS
  Administered 2015-04-26: 5 mg via INTRAVENOUS
  Administered 2015-04-26 (×2): 10 mg via INTRAVENOUS
  Administered 2015-04-26: 5 mg via INTRAVENOUS
  Administered 2015-04-26: 50 mg via INTRAVENOUS

## 2015-04-26 MED ORDER — ONDANSETRON HCL 4 MG/2ML IJ SOLN
INTRAMUSCULAR | Status: DC | PRN
  Administered 2015-04-26: 4 mg via INTRAVENOUS

## 2015-04-26 MED ORDER — POLYETHYLENE GLYCOL 3350 17 G PO PACK: 17.0000 g | PACK | Freq: Every day | ORAL | Status: DC | PRN

## 2015-04-26 SURGICAL SUPPLY — 58 items
APPLIER CLIP ROT 10 11.4 M/L (STAPLE) ×2
BINDER ABD UNIV 12 45-62 (WOUND CARE) IMPLANT
BINDER ABDOMINAL 46IN 62IN (WOUND CARE)
BLADE SURG ROTATE 9660 (MISCELLANEOUS) IMPLANT
CANISTER SUCTION 2500CC (MISCELLANEOUS) ×2 IMPLANT
CHLORAPREP W/TINT 26ML (MISCELLANEOUS) ×2 IMPLANT
CLIP APPLIE ROT 10 11.4 M/L (STAPLE) ×1 IMPLANT
COVER SURGICAL LIGHT HANDLE (MISCELLANEOUS) ×2 IMPLANT
DEVICE RELIATACK FIXATION (MISCELLANEOUS) ×2 IMPLANT
DEVICE TROCAR PUNCTURE CLOSURE (ENDOMECHANICALS) ×2 IMPLANT
DRAPE WARM FLUID 44X44 (DRAPE) ×2 IMPLANT
ELECT CAUTERY BLADE 6.4 (BLADE) ×2 IMPLANT
ELECT REM PT RETURN 9FT ADLT (ELECTROSURGICAL) ×2
ELECTRODE REM PT RTRN 9FT ADLT (ELECTROSURGICAL) ×1 IMPLANT
GLOVE BIO SURGEON STRL SZ7 (GLOVE) ×2 IMPLANT
GLOVE BIO SURGEON STRL SZ8 (GLOVE) ×4 IMPLANT
GLOVE BIOGEL PI IND STRL 7.0 (GLOVE) ×2 IMPLANT
GLOVE BIOGEL PI IND STRL 7.5 (GLOVE) ×1 IMPLANT
GLOVE BIOGEL PI IND STRL 8 (GLOVE) ×1 IMPLANT
GLOVE BIOGEL PI INDICATOR 7.0 (GLOVE) ×2
GLOVE BIOGEL PI INDICATOR 7.5 (GLOVE) ×1
GLOVE BIOGEL PI INDICATOR 8 (GLOVE) ×1
GLOVE ECLIPSE 7.5 STRL STRAW (GLOVE) ×2 IMPLANT
GLOVE SS BIOGEL STRL SZ 7 (GLOVE) ×1 IMPLANT
GLOVE SUPERSENSE BIOGEL SZ 7 (GLOVE) ×1
GOWN STRL REUS W/ TWL LRG LVL3 (GOWN DISPOSABLE) ×2 IMPLANT
GOWN STRL REUS W/ TWL XL LVL3 (GOWN DISPOSABLE) ×1 IMPLANT
GOWN STRL REUS W/TWL LRG LVL3 (GOWN DISPOSABLE) ×2
GOWN STRL REUS W/TWL XL LVL3 (GOWN DISPOSABLE) ×1
KIT BASIN OR (CUSTOM PROCEDURE TRAY) ×2 IMPLANT
KIT ROOM TURNOVER OR (KITS) ×2 IMPLANT
LIQUID BAND (GAUZE/BANDAGES/DRESSINGS) ×2 IMPLANT
MARKER SKIN DUAL TIP RULER LAB (MISCELLANEOUS) ×2 IMPLANT
MESH VENTRALIGHT ST 7X9N (Mesh General) ×2 IMPLANT
NEEDLE SPNL 18GX3.5 QUINCKE PK (NEEDLE) ×2 IMPLANT
NS IRRIG 1000ML POUR BTL (IV SOLUTION) ×2 IMPLANT
PAD ARMBOARD 7.5X6 YLW CONV (MISCELLANEOUS) ×4 IMPLANT
PENCIL BUTTON HOLSTER BLD 10FT (ELECTRODE) ×2 IMPLANT
RELOAD RELIATACK 10 (MISCELLANEOUS) ×6 IMPLANT
RELOAD RELIATACK 5 (MISCELLANEOUS) IMPLANT
SCALPEL HARMONIC ACE (MISCELLANEOUS) ×2 IMPLANT
SCISSORS LAP 5X35 DISP (ENDOMECHANICALS) ×2 IMPLANT
SET IRRIG TUBING LAPAROSCOPIC (IRRIGATION / IRRIGATOR) ×2 IMPLANT
SHEARS HARMONIC ACE PLUS 36CM (ENDOMECHANICALS) ×2 IMPLANT
SLEEVE ENDOPATH XCEL 5M (ENDOMECHANICALS) ×4 IMPLANT
SUT MON AB 4-0 PC3 18 (SUTURE) ×4 IMPLANT
SUT NOVA NAB DX-16 0-1 5-0 T12 (SUTURE) ×2 IMPLANT
SUT VIC AB 0 CT1 27 (SUTURE) ×1
SUT VIC AB 0 CT1 27XBRD ANBCTR (SUTURE) ×1 IMPLANT
TOWEL OR 17X24 6PK STRL BLUE (TOWEL DISPOSABLE) ×2 IMPLANT
TOWEL OR 17X26 10 PK STRL BLUE (TOWEL DISPOSABLE) ×2 IMPLANT
TRAY FOLEY CATH 16FR SILVER (SET/KITS/TRAYS/PACK) ×2 IMPLANT
TRAY LAPAROSCOPIC MC (CUSTOM PROCEDURE TRAY) ×2 IMPLANT
TROCAR XCEL NON-BLD 11X100MML (ENDOMECHANICALS) ×2 IMPLANT
TROCAR XCEL NON-BLD 5MMX100MML (ENDOMECHANICALS) ×2 IMPLANT
TUBE CONNECTING 12X1/4 (SUCTIONS) ×4 IMPLANT
TUBING INSUFFLATION (TUBING) ×2 IMPLANT
YANKAUER SUCT BULB TIP NO VENT (SUCTIONS) ×2 IMPLANT

## 2015-04-26 NOTE — Progress Notes (Signed)
Arrived to 6n17 at this time, husband at bedside, denies nausea/pain at this time. Oriented to room and surroundings

## 2015-04-26 NOTE — Care Management Note (Signed)
Case Management Note  Patient Details  Name: Cletus GashLeslie Henrichs MRN: [id number] Date of Birth: 03/23/1966  Subjective/Objective:                    Action/Plan:  Initial UR completed .  Expected Discharge Date:                  Expected Discharge Plan:  Home/Self Care  In-House Referral:     Discharge planning Services     Post Acute Care Choice:    Choice offered to:     DME Arranged:    DME Agency:     HH Arranged:    HH Agency:     Status of Service:  In process, will continue to follow  Medicare Important Message Given:    Date Medicare IM Given:    Medicare IM give by:    Date Additional Medicare IM Given:    Additional Medicare Important Message give by:     If discussed at Long Length of Stay Meetings, dates discussed:    Additional Comments:  Kingsley PlanWile, Garold Sheeler Marie, RN 04/26/2015, 3:19 PM

## 2015-04-26 NOTE — Op Note (Signed)
Preoperative diagnosis: Incisional hernia reducible  Postoperative diagnosis: Same  Procedure: Laparoscopic repair of incisional abdominal wall hernia with ventral light coating mesh  Surgeon: Harriette Bouillon M.D.  Anesthesia: Gen. endotracheal anesthesia with 0.25% epinephrine  EBL: Minimal  Drains: None  Specimens: None  Indications for procedure: The patient's a 77 female who had a lower Pfannenstiel incision many years ago for C-section. She developed a bulge above this just the right of the umbilicus. CT scan showed an incisional hernia measuring 6 cm in maximal diameter. On exam, she had umbilical hernia and this reducible hernia. She is morbidly obese and had significant weakening of the lower abdominal wall. She was having considerable pain from this and desired repair. We discussed the risk repair as well as the risk of recurrence. She was morbidly obese and explained to her that increases her risk of hernia tenfold. Unfortunately, any attempt weight reduction would take some time and given the amount of pain is could not be a reasonable option for her. We discussed laparoscopic repair with mesh as well as open repair with mesh. The pros and cons of each were discussed. I explained risk of recurrence to her as mentioned above.The risk of hernia repair include bleeding,  Infection,   Recurrence of the hernia,  Mesh use, chronic pain,  Organ injury,  Bowel injury,  Bladder injury,   nerve injury with numbness around the incision,  Death,  and worsening of preexisting  medical problems.  The alternatives to surgery have been discussed as well..  Long term expectations of both operative and non operative treatments have been discussed.   The patient agrees to proceed.  Description of procedure: The patient was met in the holding area and questions were answered. She was then taken back to the operating room and placed supine on the OR table. After induction of general anesthesia, a Foley  catheter was placed under sterile conditions. Both arms were tucked. The abdomen was prepped and draped in a sterile fashion. Timeout was done to verify proper patient and procedure. She received 2 g of Ancef. A 5 mm left upper abdominal port was placed using the Optiview scope. This was advanced through all layers of abdominal wall under direct vision. Once entering the abdominal cavity pneumoperitoneum was established to 15 mmHg of CO2 pressure. Laparoscope was then placed. No evidence of injury to internal viscera with insertion of this port. Upon examination the abdominal cavity, she had a 6 cm incisional hernia approximately 3 cm below divided the umbilicus. She had significant weakening of the entire lower abdominal wall without hernia. There was some omentum in the hernia but otherwise no hollow organ. I placed additional 3 other 5 mm ports 1 the left lower quadrant, one in the right upper quadrant the last right lower quadrant. Harmonic scalpel was used to take the omentum out of the hernia sac I was able to identify the bladder is well pubic symphysis and will decompress the Foley. I used a 17.x 12 cm coated ventral light mesh. 4 tacking sutures were placed in the mesh was hydrated. A lower midline incision was used over the actual hernia sac we dissected down into the hernia defect. Coker used to grab the fascia the mesh was inserted continued on cavity I closed the fascia with #1 Vicryl. We closed the hernia sac down with 3-0 Vicryl for Monocryl for the skin. We then reinsufflated the abdominal cavity. The mesh was unfurled with the smooth side toward the bowel. A suture graspers  used and the suture was pulled out just above the pubic symphysis inferiorly, above the umbilicus superiorly, and laterally. The mesh covered the defect with at least 6 cm of overlap and covered the weakening area of the lower abdominal wall down to the pubis. An absorbable tacker was used to The mesh with 2 layers  circumferentially. The bladder was well visualized and decompressed out of the operative field the Foley catheter below the pubis. Hemostasis was achieved. There was some bleeding from the left lateral port site and I controlled this with a suture passer in a 0 Vicryl past round. Small bowel and colon were examined and there is no evidence of injury to the internal viscera. The ports were then removed allowing the CO2 to escape. Port sites closed with 4-0 Monocryl. A liquid adhesive applied. All final counts found to be correct. Foley catheter removed at the end of the case. Abdominal binder was placed. Patient was awoke extubated taken to recovery in satisfactory condition.

## 2015-04-26 NOTE — Anesthesia Procedure Notes (Signed)
Procedure Name: Intubation Date/Time: 04/26/2015 9:27 AM Performed by: Rise PatienceBELL, Dierdra Salameh T Pre-anesthesia Checklist: Patient identified, Emergency Drugs available, Suction available and Patient being monitored Patient Re-evaluated:Patient Re-evaluated prior to inductionOxygen Delivery Method: Circle system utilized Preoxygenation: Pre-oxygenation with 100% oxygen Intubation Type: IV induction Ventilation: Mask ventilation without difficulty Laryngoscope Size: Miller and 2 Grade View: Grade I Tube type: Oral Tube size: 7.5 mm Number of attempts: 1 Airway Equipment and Method: Stylet Placement Confirmation: ETT inserted through vocal cords under direct vision,  positive ETCO2 and breath sounds checked- equal and bilateral Secured at: 22 cm Tube secured with: Tape Dental Injury: Teeth and Oropharynx as per pre-operative assessment

## 2015-04-26 NOTE — H&P (Addendum)
H&P   Hannah Chapman (MR# [id number])      H&P Info    Author Note Status Last Update User Last Update Date/Time   Harriette Bouillon, MD Signed Harriette Bouillon, MD     H&P    Expand All Collapse All   Hannah Chapman  Location: [facility] Surgery Patient #: [id number] DOB: 07-12-1965 Married / Language: [place] / Race: White Female  History of Present Illness Hannah Fus A. Timofey Carandang MD; 1 Patient words: Patient sent at the request of Hannah Chapman for bulge of abdominal wall 2 months. Patient has noticed a bulge in her lower abdominal wall just above her hysterectomy scar site for 2 months. It's more noticeable when she stands. Not causing any significant pain at this point. She does have chronic constipation she states. No nausea or vomiting. A CT scan was obtained which shows a 6 cm lower abdominal wall hernia and a second 1.8 cm periumbilical hernia.  The patient is a 50 year old female.   Other Problems Fay Records, [place] Anxiety Disorder Heart murmur Thyroid Disease  Past Surgical History Fay Records, CMA;  Appendectomy Hysterectomy (not due to cancer) - Complete  Diagnostic Studies History Fay Records, CMA;  Colonoscopy never Mammogram within last year Pap Smear >5 years ago  Allergies Fay Records, CMA;  Sulfa 10 *OPHTHALMIC AGENTS* Rash.  Medication History Fay Records, CMA;  Premarin (0.625MG  Tablet, Oral) Active. Ibuprofen (800MG  Tablet, Oral) Active. Medications Reconciled  Pregnancy / Birth History Fay Records, CMA;  Age at menarche 11 years. Age of menopause 89-50 Gravida 3 Maternal age 31-20 Para 3     Review of Systems Fay Records CMA;  General Present- Fatigue and Weight Gain. Not Present- Appetite Loss, Chills, Fever, Night Sweats and Weight Loss. Skin Present- Dryness. Not Present- Change in Wart/Mole, Hives, Jaundice, New Lesions, Non-Healing Wounds, Rash and Ulcer. HEENT Not Present- Earache, Hearing Loss,  Hoarseness, Nose Bleed, Oral Ulcers, Ringing in the Ears, Seasonal Allergies, Sinus Pain, Sore Throat, Visual Disturbances, Wears glasses/contact lenses and Yellow Eyes. Respiratory Present- Snoring. Not Present- Bloody sputum, Chronic Cough, Difficulty Breathing and Wheezing. Breast Not Present- Breast Mass, Breast Pain, Nipple Discharge and Skin Changes. Cardiovascular Not Present- Chest Pain, Difficulty Breathing Lying Down, Leg Cramps, Palpitations, Rapid Heart Rate, Shortness of Breath and Swelling of Extremities. Gastrointestinal Present- Bloating and Constipation. Not Present- Abdominal Pain, Bloody Stool, Change in Bowel Habits, Chronic diarrhea, Difficulty Swallowing, Excessive gas, Gets full quickly at meals, Hemorrhoids, Indigestion, Nausea, Rectal Pain and Vomiting. Female Genitourinary Present- Frequency. Not Present- Nocturia, Painful Urination, Pelvic Pain and Urgency. Musculoskeletal Present- Back Pain. Not Present- Joint Pain, Joint Stiffness, Muscle Pain, Muscle Weakness and Swelling of Extremities. Neurological Not Present- Decreased Memory, Fainting, Headaches, Numbness, Seizures, Tingling, Tremor, Trouble walking and Weakness. Psychiatric Present- Anxiety. Not Present- Bipolar, Change in Sleep Pattern, Depression, Fearful and Frequent crying. Endocrine Present- Hot flashes. Not Present- Cold Intolerance, Excessive Hunger, Hair Changes, Heat Intolerance and New Diabetes. Hematology Not Present- Easy Bruising, Excessive bleeding, Gland problems, HIV and Persistent Infections.  Vitals Fay Records CMA;  03/12/2015 10:45 AM Weight: 192 lb Height: 60in Body Surface Area: 1.83 m Body Mass Index: 37.5 kg/m  Temp.: 97.35F(Temporal)  Pulse: 117 (Regular)  BP: 126/74 (Sitting, Left Arm, Standard)      Physical Exam ([name] MD;   General Mental Status-Alert. General Appearance-Consistent with stated age. Hydration-Well  hydrated. Voice-Normal.  Head and Neck Head-normocephalic, atraumatic with no lesions or palpable masses. Trachea-midline. Thyroid Gland Characteristics -  normal size and consistency.  Chest and Lung Exam Chest and lung exam reveals -quiet, even and easy respiratory effort with no use of accessory muscles and on auscultation, normal breath sounds, no adventitious sounds and normal vocal resonance. Inspection Chest Wall - Normal. Back - normal.  Abdomen Note: 6 cm right lower quadrant reducible hernia. Soft nontender. Nondistended.     Assessment & Plan ([name] MD;  INCISIONAL HERNIA, WITHOUT OBSTRUCTION OR GANGRENE (K43.2) Impression: Discussed laparoscopic and open hernia repair with mesh. The pros and cons of each discussed. The risks and benefits of each discussed. She would like to proceed with surgical repair using a laparoscopic technique.   The risk of hernia repair include bleeding, infection, organ injury, bowel injury, bladder injury, nerve injury recurrent hernia, blood clots, worsening of underlying condition, chronic pain, mesh use, open surgery, death, and the need for other operattions. Pt agrees to proceed  Current Plans You are being scheduled for surgery - Our schedulers will call you.  You should hear from our office's scheduling department within 5 working days about the location, date, and time of surgery. We try to make accommodations for patient's preferences in scheduling surgery, but sometimes the OR schedule or the surgeon's schedule prevents us from making those accommodations.  If you have not heard from our office [phone]) in 5 working days, call the office and ask for your surgeon's nurse.  If you have other questions about your diagnosis, plan, or surgery, call the office and ask for your surgeon's nurse.  The anatomy & physiology of the abdominal wall was discussed. The pathophysiology of hernias was discussed. Natural  history risks without surgery including progeressive enlargement, pain, incarceration, & strangulation was discussed. Contributors to complications such as smoking, obesity, diabetes, prior surgery, etc were discussed.  I feel the risks of no intervention will lead to serious problems that outweigh the operative risks; therefore, I recommended surgery to reduce and repair the hernia. I explained laparoscopic techniques with possible need for an open approach. I noted the probable use of mesh to patch and/or buttress the hernia repair  Risks such as bleeding, infection, abscess, need for further treatment, heart attack, death, and other risks were discussed. I noted a good likelihood this will help address the problem. Goals of post-operative recovery were discussed as well. Possibility that this will not correct all symptoms was explained. I stressed the importance of low-impact activity, aggressive pain control, avoiding constipation, & not pushing through pain to minimize risk of post-operative chronic pain or injury. Possibility of reherniation especially with smoking, obesity, diabetes, immunosuppression, and other health conditions was discussed. We will work to minimize complications.  An educational handout further explaining the pathology & treatment options was given as well. Questions were answered. The patient expresses understanding & wishes to proceed with surgery.  Pt Education - [facility] Hernia Post-Op HCI (Gross): discussed with patient and provided information. Pt Education - [facility] Pain Control (Gross)

## 2015-04-26 NOTE — Transfer of Care (Signed)
Immediate Anesthesia Transfer of Care Note  Patient: Cletus GashLeslie Andrada  Procedure(s) Performed: Procedure(s): LAPAROSCOPIC INCISIONAL HERNIA  (N/A) INSERTION OF MESH (N/A)  Patient Location: PACU  Anesthesia Type:General  Level of Consciousness: awake, alert  and oriented  Airway & Oxygen Therapy: Patient Spontanous Breathing and Patient connected to nasal cannula oxygen  Post-op Assessment: Report given to RN, Post -op Vital signs reviewed and stable and Patient moving all extremities X 4  Post vital signs: Reviewed and stable  Last Vitals:  Filed Vitals:   04/26/15 0754  BP: 138/74  Pulse: 92  Temp: 36.5 C  Resp: 20    Complications: No apparent anesthesia complications

## 2015-04-26 NOTE — Anesthesia Postprocedure Evaluation (Signed)
Anesthesia Post Note  Patient: Hannah GashLeslie Chapman  Procedure(s) Performed: Procedure(s) (LRB): LAPAROSCOPIC INCISIONAL HERNIA  (N/A) INSERTION OF MESH (N/A)  Patient location during evaluation: PACU Anesthesia Type: General Level of consciousness: awake and alert Pain management: pain level controlled Vital Signs Assessment: post-procedure vital signs reviewed and stable Respiratory status: spontaneous breathing, nonlabored ventilation, respiratory function stable and patient connected to nasal cannula oxygen Cardiovascular status: blood pressure returned to baseline and stable Postop Assessment: no signs of nausea or vomiting Anesthetic complications: no    Last Vitals:  Filed Vitals:   04/26/15 1300 04/26/15 1313  BP:  109/71  Pulse: 85 104  Temp:    Resp: 14 20    Last Pain:  Filed Vitals:   04/26/15 1315  PainSc: 5                  Zuri Bradway DAVID

## 2015-04-26 NOTE — Interval H&P Note (Signed)
History and Physical Interval Note:  04/26/2015 8:46 AM  Hannah Chapman  has presented today for surgery, with the diagnosis of incisional hernia reduceable  The various methods of treatment have been discussed with the patient and family. After consideration of risks, benefits and other options for treatment, the patient has consented to  Procedure(s): LAPAROSCOPIC INCISIONAL HERNIA  (N/A) INSERTION OF MESH (N/A) as a surgical intervention .  The patient's history has been reviewed, patient examined, no change in status, stable for surgery.  I have reviewed the patient's chart and labs.  Questions were answered to the patient's satisfaction.     Asah Lamay A.

## 2015-04-26 NOTE — Anesthesia Preprocedure Evaluation (Addendum)
Anesthesia Evaluation  Patient identified by MRN, date of birth, ID band Patient awake    Reviewed: Allergy & Precautions, NPO status , Patient's Chart, lab work & pertinent test results  Airway Mallampati: II  TM Distance: >3 FB Neck ROM: Full    Dental  (+) Dental Advisory Given, Partial Lower, Partial Upper,    Pulmonary Current Smoker,    Pulmonary exam normal        Cardiovascular Normal cardiovascular exam     Neuro/Psych PSYCHIATRIC DISORDERS Anxiety    GI/Hepatic   Endo/Other  Morbid obesity  Renal/GU      Musculoskeletal   Abdominal   Peds  Hematology   Anesthesia Other Findings   Reproductive/Obstetrics                           Anesthesia Physical Anesthesia Plan  ASA: II  Anesthesia Plan: General   Post-op Pain Management:    Induction: Intravenous  Airway Management Planned: Oral ETT  Additional Equipment:   Intra-op Plan:   Post-operative Plan: Extubation in OR  Informed Consent: I have reviewed the patients History and Physical, chart, labs and discussed the procedure including the risks, benefits and alternatives for the proposed anesthesia with the patient or authorized representative who has indicated his/her understanding and acceptance.   Dental advisory given  Plan Discussed with: CRNA and Surgeon  Anesthesia Plan Comments:        Anesthesia Quick Evaluation

## 2015-04-27 ENCOUNTER — Encounter (HOSPITAL_COMMUNITY): Payer: Self-pay | Admitting: Surgery

## 2015-04-27 MED ORDER — ONDANSETRON 4 MG PO TBDP: 4.0000 mg | ORAL_TABLET | Freq: Four times a day (QID) | ORAL | Status: AC | PRN

## 2015-04-27 MED ORDER — OXYCODONE HCL 5 MG PO TABS: 5.0000 mg | ORAL_TABLET | ORAL | Status: DC | PRN

## 2015-04-27 MED ORDER — METHOCARBAMOL 500 MG PO TABS: 500.0000 mg | ORAL_TABLET | Freq: Four times a day (QID) | ORAL | Status: AC | PRN

## 2015-04-27 MED ORDER — POLYETHYLENE GLYCOL 3350 17 G PO PACK: 17.0000 g | PACK | Freq: Every day | ORAL | Status: DC | PRN

## 2015-04-27 MED ORDER — ALUM & MAG HYDROXIDE-SIMETH 200-200-20 MG/5ML PO SUSP
30.0000 mL | ORAL | Status: DC | PRN
  Administered 2015-04-27: 30 mL via ORAL
  Filled 2015-04-27: qty 30

## 2015-04-27 NOTE — Discharge Summary (Signed)
Physician Discharge Summary  Patient ID: Hannah GashLeslie Chapman MRN: [id number] DOB/AGE: 52/12/1965 52 y.o.  Admit date: 04/26/2015 Discharge date: 04/27/2015  Admission Diagnoses:incisional hernia  Discharge Diagnoses:  Active Problems:   Incisional hernia   Discharged Condition: good  Hospital Course: pt had some initial N/V but this resolved  Pain control was good and she was tolerating her diet  Wounds were clean and she was ambulatory  Consults: None  Significant Diagnostic Studies: none  Treatments: surgery: laparoscopic incisional hernia repair   Discharge Exam: Blood pressure 105/76, pulse 93, temperature 99.2 F (37.3 C), temperature source Oral, resp. rate 18, weight 87.998 kg (194 lb), SpO2 93 %. General appearance: alert and cooperative Resp: clear to auscultation bilaterally Incision/Wound:CDI soft but sore abdomen   Disposition: 01-Home or Self Care  Discharge Instructions    Diet - low sodium heart healthy    Complete by:  As directed      Driving Restrictions    Complete by:  As directed   No driving for 2 weeks     Increase activity slowly    Complete by:  As directed      Lifting restrictions    Complete by:  As directed   No lifting for 6 weeks            Medication List    TAKE these medications        estrogens (conjugated) 0.625 MG tablet  Commonly known as:  PREMARIN  Take 0.625 mg by mouth daily.     ibuprofen 800 MG tablet  Commonly known as:  ADVIL,MOTRIN  Take 1 tablet (800 mg total) by mouth 3 (three) times daily.     methocarbamol 500 MG tablet  Commonly known as:  ROBAXIN  Take 1 tablet (500 mg total) by mouth every 6 (six) hours as needed for muscle spasms.     ondansetron 4 MG disintegrating tablet  Commonly known as:  ZOFRAN-ODT  Take 1 tablet (4 mg total) by mouth every 6 (six) hours as needed for nausea.     oxyCODONE 5 MG immediate release tablet  Commonly known as:  Oxy IR/ROXICODONE  Take 1-2 tablets (5-10 mg total)  by mouth every 4 (four) hours as needed for moderate pain.     polyethylene glycol packet  Commonly known as:  MIRALAX / GLYCOLAX  Take 17 g by mouth daily as needed for mild constipation.     traMADol 50 MG tablet  Commonly known as:  ULTRAM  Take 1 tablet (50 mg total) by mouth every 6 (six) hours as needed for pain.         Signed: Rhylen Shaheen A. 04/27/2015, 8:58 AM

## 2015-04-27 NOTE — Progress Notes (Signed)
Dc home with husband. Both verbally understand DC instructions, no questions asked.

## 2015-04-27 NOTE — Discharge Instructions (Signed)

## 2015-04-27 NOTE — Progress Notes (Signed)
1 Day Post-Op  Subjective: Doing better nausea vomiting gone   Objective: Vital signs in last 24 hours: Temp:  [97 F (36.1 C)-99.3 F (37.4 C)] 99.2 F (37.3 C) (01/13 0602) Pulse Rate:  [85-104] 93 (01/13 0602) Resp:  [10-22] 18 (01/13 0602) BP: (99-129)/(59-78) 105/76 mmHg (01/13 0602) SpO2:  [92 %-98 %] 93 % (01/13 0602) Last BM Date: 04/26/15  Intake/Output from previous day: 01/12 0701 - 01/13 0700 In: 3128.3 [P.O.:480; I.V.:2598.3; IV Piggyback:50] Out: 1480 [Urine:950; Emesis/NG output:500; Blood:30] Intake/Output this shift:    Incision/Wound:CDI abdomen soft  Sore appropriately   Lab Results:  No results for input(s): WBC, HGB, HCT, PLT in the last 72 hours. BMET No results for input(s): NA, K, CL, CO2, GLUCOSE, BUN, CREATININE, CALCIUM in the last 72 hours. PT/INR No results for input(s): LABPROT, INR in the last 72 hours. ABG No results for input(s): PHART, HCO3 in the last 72 hours.  Invalid input(s): PCO2, PO2  Studies/Results: No results found.  Anti-infectives: Anti-infectives    Start     Dose/Rate Route Frequency Ordered Stop   04/26/15 1745  ceFAZolin (ANCEF) IVPB 2 g/50 mL premix     2 g 100 mL/hr over 30 Minutes Intravenous 3 times per day 04/26/15 1423 04/26/15 1536   04/26/15 0830  ceFAZolin (ANCEF) IVPB 2 g/50 mL premix     2 g 100 mL/hr over 30 Minutes Intravenous On call to O.R. 04/26/15 0821 04/26/15 0950   04/26/15 0827  ceFAZolin (ANCEF) 2-3 GM-% IVPB SOLR    Comments:  Ray ChurchBowman, Jennifer   : cabinet override      04/26/15 0827 04/26/15 2029      Assessment/Plan: s/p Procedure(s): LAPAROSCOPIC INCISIONAL HERNIA  (N/A) INSERTION OF MESH (N/A) Discharge  LOS: 1 day    Tishie Altmann A. 04/27/2015

## 2015-05-05 ENCOUNTER — Emergency Department (HOSPITAL_BASED_OUTPATIENT_CLINIC_OR_DEPARTMENT_OTHER)
Admission: EM | Admit: 2015-05-05 | Discharge: 2015-05-05 | Disposition: A | Discharge status: Home / Self Care | Payer: Managed Care, Other (non HMO) | Attending: Emergency Medicine | Admitting: Emergency Medicine

## 2015-05-05 ENCOUNTER — Encounter (HOSPITAL_BASED_OUTPATIENT_CLINIC_OR_DEPARTMENT_OTHER): Payer: Self-pay | Admitting: *Deleted

## 2015-05-05 DIAGNOSIS — R21 Rash and other nonspecific skin eruption: Secondary | ICD-10-CM | POA: Diagnosis present

## 2015-05-05 DIAGNOSIS — F419 Anxiety disorder, unspecified: Secondary | ICD-10-CM | POA: Insufficient documentation

## 2015-05-05 DIAGNOSIS — L509 Urticaria, unspecified: Secondary | ICD-10-CM | POA: Diagnosis not present

## 2015-05-05 DIAGNOSIS — Z793 Long term (current) use of hormonal contraceptives: Secondary | ICD-10-CM | POA: Insufficient documentation

## 2015-05-05 DIAGNOSIS — Z79899 Other long term (current) drug therapy: Secondary | ICD-10-CM | POA: Insufficient documentation

## 2015-05-05 DIAGNOSIS — F17210 Nicotine dependence, cigarettes, uncomplicated: Secondary | ICD-10-CM | POA: Insufficient documentation

## 2015-05-05 MED ORDER — METHYLPREDNISOLONE 4 MG PO TBPK: ORAL_TABLET | ORAL | Status: DC

## 2015-05-05 MED ORDER — METHYLPREDNISOLONE SODIUM SUCC 125 MG IJ SOLR
125.0000 mg | Freq: Once | INTRAMUSCULAR | Status: AC
  Administered 2015-05-05: 125 mg via INTRAMUSCULAR
  Filled 2015-05-05: qty 2

## 2015-05-05 NOTE — Discharge Instructions (Signed)
Hives Hives are itchy, red, swollen areas of the skin. They can vary in size and location on your body. Hives can come and go for hours or several days (acute hives) or for several weeks (chronic hives). Hives do not spread from person to person (noncontagious). They may get worse with scratching, exercise, and emotional stress. CAUSES   Allergic reaction to food, additives, or drugs.  Infections, including the common cold.  Illness, such as vasculitis, lupus, or thyroid disease.  Exposure to sunlight, heat, or cold.  Exercise.  Stress.  Contact with chemicals. SYMPTOMS   Red or white swollen patches on the skin. The patches may change size, shape, and location quickly and repeatedly.  Itching.  Swelling of the hands, feet, and face. This may occur if hives develop deeper in the skin. DIAGNOSIS  Your caregiver can usually tell what is wrong by performing a physical exam. Skin or blood tests may also be done to determine the cause of your hives. In some cases, the cause cannot be determined. TREATMENT  Mild cases usually get better with medicines such as antihistamines. Severe cases may require an emergency epinephrine injection. If the cause of your hives is known, treatment includes avoiding that trigger.  HOME CARE INSTRUCTIONS   Avoid causes that trigger your hives.  Take antihistamines as directed by your caregiver to reduce the severity of your hives. Non-sedating or low-sedating antihistamines are usually recommended. Do not drive while taking an antihistamine.  Take any other medicines prescribed for itching as directed by your caregiver.  Wear loose-fitting clothing.  Keep all follow-up appointments as directed by your caregiver. SEEK MEDICAL CARE IF:   You have persistent or severe itching that is not relieved with medicine.  You have painful or swollen joints. SEEK IMMEDIATE MEDICAL CARE IF:   You have a fever.  Your tongue or lips are swollen.  You have  trouble breathing or swallowing.  You feel tightness in the throat or chest.  You have abdominal pain. These problems may be the first sign of a life-threatening allergic reaction. Call your local emergency services (911 in [place] MAKE SURE YOU:   Understand these instructions.  Will watch your condition.  Will get help right away if you are not doing well or get worse.   This information is not intended to replace advice given to you by your health care provider. Make sure you discuss any questions you have with your health care provider.   Document Released: 03/31/2005 Document Revised: 04/05/2013 Document Reviewed: 06/24/2011 Elsevier Interactive Patient Education 2016 [employer]vier Inc[employer]  

## 2015-05-05 NOTE — ED Provider Notes (Addendum)
CSN: [id number]     Arrival date & time 05/05/15  0451 History   First MD Initiated Contact with Patient 05/05/15 0507     Chief Complaint  Patient presents with  . Rash     (Consider location/radiation/quality/duration/timing/severity/associated sxs/prior Treatment) HPI  This is a 50 year old female who underwent a mesh herniorrhaphy on the 12th of this month. She is here with a 2 day history of an urticarial rash. The rash is generalized and pruritic. There has been no associated throat swelling, tongue swelling, shortness of breath, nausea or vomiting. She has had some loose stools. She has been taking 50 milligrams of Benadryl every 4 hours without relief. She has also applied hydrocortisone cream without relief.  Past Medical History  Diagnosis Date  . Anxiety   . Complication of anesthesia   . PONV (postoperative nausea and vomiting)    Past Surgical History  Procedure Laterality Date  . Abdominal hysterectomy    . Appendectomy    . Biopsy thyroid      neg.  . Incisional hernia repair  04/26/2015    laproscopic   . Incisional hernia repair N/A 04/26/2015    Procedure: LAPAROSCOPIC INCISIONAL HERNIA ;  Surgeon: Harriette Bouillon, MD;  Location: [facility] OR;  Service: General;  Laterality: N/A;  . Insertion of mesh N/A 04/26/2015    Procedure: INSERTION OF MESH;  Surgeon: Harriette Bouillon, MD;  Location: [facility] OR;  Service: General;  Laterality: N/A;   No family history on file. Social History  Substance Use Topics  . Smoking status: Current Every Day Smoker -- 0.50 packs/day for 34 years    Types: Cigarettes  . Smokeless tobacco: Never Used  . Alcohol Use: 3.0 oz/week    5 Glasses of wine per week     Comment: occ   OB History    No data available     Review of Systems  All other systems reviewed and are negative.   Allergies  Sulfa antibiotics  Home Medications   Prior to Admission medications   Medication Sig Start Date End Date Taking? Authorizing Provider  estrogens,  conjugated, (PREMARIN) 0.625 MG tablet Take 0.625 mg by mouth daily.    Historical Provider, MD  ibuprofen (ADVIL,MOTRIN) 800 MG tablet Take 1 tablet (800 mg total) by mouth 3 (three) times daily. Patient not taking: Reported on 04/13/2015 05/16/12   Gilda Crease, MD  methocarbamol (ROBAXIN) 500 MG tablet Take 1 tablet (500 mg total) by mouth every 6 (six) hours as needed for muscle spasms. 04/27/15   Harriette Bouillon, MD  ondansetron (ZOFRAN-ODT) 4 MG disintegrating tablet Take 1 tablet (4 mg total) by mouth every 6 (six) hours as needed for nausea. 04/27/15   Harriette Bouillon, MD  oxyCODONE (OXY IR/ROXICODONE) 5 MG immediate release tablet Take 1-2 tablets (5-10 mg total) by mouth every 4 (four) hours as needed for moderate pain. 04/27/15   Harriette Bouillon, MD  polyethylene glycol (MIRALAX / GLYCOLAX) packet Take 17 g by mouth daily as needed for mild constipation. 04/27/15   Harriette Bouillon, MD  traMADol (ULTRAM) 50 MG tablet Take 1 tablet (50 mg total) by mouth every 6 (six) hours as needed for pain. Patient not taking: Reported on 04/13/2015 05/16/12   Gilda Crease, MD   BP 151/90 mmHg  Pulse 128  Temp(Src) 98.4 F (36.9 C)  Resp 19  SpO2 99%   Physical Exam  General: Well-developed, well-nourished female in no acute distress; appearance consistent with age of record HENT: normocephalic;  atraumatic; no pharyngeal edema Eyes: pupils equal, round and reactive to light; extraocular muscles intact Neck: supple Heart: regular rate and rhythm Lungs: clear to auscultation bilaterally Abdomen: soft; nondistended; nontender; bowel sounds present Extremities: No deformity; full range of motion Neurologic: Awake, alert and oriented; motor function intact in all extremities and symmetric; no facial droop Skin: Warm and dry; generalized urticarial rash Psychiatric: Normal mood and affect    ED Course  Procedures (including critical care time)   MDM  The patient was advised the  possibility of an allergic reaction to her mesh repair. If symptoms persist she should consult with her surgeon. She was advised to continue taking Benadryl and we will place her on a steroid taper.    Paula Libra, MD 05/05/15 1610  Paula Libra, MD 05/05/15 9604

## 2015-05-05 NOTE — ED Notes (Signed)
Pt states that she has had a rash since Thursday. She has been taking benadryl without relief (last at 0330)

## 2015-05-21 ENCOUNTER — Ambulatory Visit: Payer: Managed Care, Other (non HMO)

## 2015-05-31 ENCOUNTER — Ambulatory Visit
Admission: RE | Admit: 2015-05-31 | Discharge: 2015-05-31 | Disposition: A | Discharge status: Home / Self Care | Payer: Managed Care, Other (non HMO) | Source: Ambulatory Visit

## 2015-05-31 DIAGNOSIS — Z1231 Encounter for screening mammogram for malignant neoplasm of breast: Secondary | ICD-10-CM

## 2016-02-14 ENCOUNTER — Emergency Department (HOSPITAL_BASED_OUTPATIENT_CLINIC_OR_DEPARTMENT_OTHER)
Admission: EM | Admit: 2016-02-14 | Discharge: 2016-02-14 | Disposition: A | Discharge status: Home / Self Care | Payer: Managed Care, Other (non HMO) | Attending: Emergency Medicine | Admitting: Emergency Medicine

## 2016-02-14 ENCOUNTER — Encounter (HOSPITAL_BASED_OUTPATIENT_CLINIC_OR_DEPARTMENT_OTHER): Payer: Self-pay | Admitting: *Deleted

## 2016-02-14 DIAGNOSIS — R0789 Other chest pain: Secondary | ICD-10-CM | POA: Diagnosis not present

## 2016-02-14 DIAGNOSIS — Z79899 Other long term (current) drug therapy: Secondary | ICD-10-CM | POA: Insufficient documentation

## 2016-02-14 DIAGNOSIS — R079 Chest pain, unspecified: Secondary | ICD-10-CM | POA: Diagnosis present

## 2016-02-14 DIAGNOSIS — F17210 Nicotine dependence, cigarettes, uncomplicated: Secondary | ICD-10-CM | POA: Diagnosis not present

## 2016-02-14 NOTE — ED Triage Notes (Signed)
Woke with a sharp pain in her left chest with radiation into her back. Worse when she takes a deep breath.

## 2016-02-14 NOTE — ED Provider Notes (Signed)
[facility] Provider Note   CSN: [id number] Arrival date & time: 02/14/16  1234  History   Chief Complaint Chief Complaint  Patient presents with  . Chest Pain   HPI Hannah Chapman is a 50 y.o. female presenting with chest pain.  HPI  Reports she woke up with chest pain in her left chest with radiation into her back this morning at 3:30am. Pain has been present constantly since that time. Worse when she takes a deep breath or moves a certain way. Has not tried any medication. Denies shortness of breath, diaphoresis, radiation to neck or arm, nausea. Able to go up stairs without becoming short of breath. Denies cough.   Smoking status noted. Currently smokes 1/2 ppd.    Past Medical History:  Diagnosis Date  . Anxiety   . Complication of anesthesia   . PONV (postoperative nausea and vomiting)     Patient Active Problem List   Diagnosis Date Noted  . Incisional hernia 04/26/2015    Past Surgical History:  Procedure Laterality Date  . ABDOMINAL HYSTERECTOMY    . APPENDECTOMY    . BIOPSY THYROID     neg.  Sherald Hess. INCISIONAL HERNIA REPAIR  04/26/2015   laproscopic   . INCISIONAL HERNIA REPAIR N/A 04/26/2015   Procedure: LAPAROSCOPIC INCISIONAL HERNIA ;  Surgeon: Harriette Bouillonhomas Cornett, MD;  [facility];  Service: General;  Laterality: N/A;  . INSERTION OF MESH N/A 04/26/2015   Procedure: INSERTION OF MESH;  Surgeon: Harriette Bouillonhomas Cornett, MD;  Location: [facility] OR;  Service: General;  Laterality: N/A;    OB History    No data available     Home Medications    Prior to Admission medications   Medication Sig Start Date End Date Taking? Authorizing Provider  estrogens, conjugated, (PREMARIN) 0.625 MG tablet Take 0.625 mg by mouth daily.   Yes Historical Provider, MD  methocarbamol (ROBAXIN) 500 MG tablet Take 1 tablet (500 mg total) by mouth every 6 (six) hours as needed for muscle spasms. 04/27/15   Harriette Bouillonhomas Cornett, MD  methylPREDNISolone (MEDROL DOSEPAK) 4 MG TBPK tablet Take  tapering dose per package instructions starting Sunday, January 22. 05/05/15   John Molpus, MD  ondansetron (ZOFRAN-ODT) 4 MG disintegrating tablet Take 1 tablet (4 mg total) by mouth every 6 (six) hours as needed for nausea. 04/27/15   Harriette Bouillonhomas Cornett, MD  oxyCODONE (OXY IR/ROXICODONE) 5 MG immediate release tablet Take 1-2 tablets (5-10 mg total) by mouth every 4 (four) hours as needed for moderate pain. 04/27/15   Harriette Bouillonhomas Cornett, MD  polyethylene glycol (MIRALAX / GLYCOLAX) packet Take 17 g by mouth daily as needed for mild constipation. 04/27/15   Harriette Bouillonhomas Cornett, MD    Family History No family history on file.  Social History Social History  Substance Use Topics  . Smoking status: Current Every Day Smoker    Packs/day: 0.50    Years: 34.00    Types: Cigarettes  . Smokeless tobacco: Never Used  . Alcohol use 3.0 oz/week    5 Glasses of wine per week     Comment: occ     Allergies   Sulfa antibiotics   Review of Systems Review of Systems  Constitutional: Negative for fever.  Respiratory: Negative for cough and shortness of breath.   Cardiovascular: Positive for chest pain. Negative for palpitations.  Gastrointestinal: Negative for abdominal pain and nausea.  Skin: Negative for rash.     Physical Exam Updated Vital Signs BP 150/94   Pulse 111  Temp 98.1 F (36.7 C) (Oral)   Resp 20   Ht 5\' 1"  (1.549 m)   Wt 87.1 kg   SpO2 96%   BMI 36.28 kg/m   Physical Exam  Constitutional: She appears well-developed and well-nourished. No distress.  Cardiovascular: Normal rate and regular rhythm.  Exam reveals no gallop and no friction rub.   No murmur heard. Pulmonary/Chest: Effort normal. No respiratory distress. She has no wheezes. She exhibits tenderness.  Left sided ribs restricted in exhalation  Abdominal: Soft. Bowel sounds are normal. She exhibits no distension. There is no tenderness.  Musculoskeletal: She exhibits no edema.  Skin: No rash noted. She is not  diaphoretic.  Psychiatric: She has a normal mood and affect. Her behavior is normal.     ED Treatments / Results  Labs (all labs ordered are listed, but only abnormal results are displayed) Labs Reviewed - No data to display  EKG  EKG Interpretation  Date/Time:  Thursday February 14 2016 12:46:22 EDT Ventricular Rate:  98 PR Interval:    QRS Duration: 96 QT Interval:  360 QTC Calculation: 460 R Axis:   29 Text Interpretation:  Sinus rhythm Short PR interval No significant change since last tracing Confirmed by FLOYD MD, Reuel BoomANIEL [id number]) on 02/14/2016 1:03:41 PM       Radiology No results found.  Procedures Procedures (including critical care time)  Medications Ordered in ED Medications - No data to display   Initial Impression / Assessment and Plan / ED Course  I have reviewed the triage vital signs and the nursing notes.  Pertinent labs & imaging results that were available during my care of the patient were reviewed by me and considered in my medical decision making (see chart for details).  Clinical Course   EKG reviewed. Normal sinus rhythm without signs of ST abnormalities. Exam and history consistent with musculoskeletal etiology of pain. Cardiac etiology unlikely given constant presence since this morning, no shortness of breath, reproduction of pain with palpation. Shingles considered since pain can occur prior to rash. Further imaging and labs not indicated at this time.  Final Clinical Impressions(s) / ED Diagnoses   Final diagnoses:  Chest wall pain  Most likely musculoskeletal in etiology. Recommend Tylenol or Ibuprofen. Red flag symptoms discussed. To notify PCP if rash develops. Follow up with PCP in one week.  New Prescriptions New Prescriptions   No medications on file     [facility] ParkRaleigh N Rithvik Orcutt, DO 02/14/16 1325    Melene Planan Floyd, DO 02/14/16 1336

## 2016-07-16 ENCOUNTER — Other Ambulatory Visit: Payer: Self-pay | Admitting: Obstetrics and Gynecology

## 2016-07-16 DIAGNOSIS — Z1231 Encounter for screening mammogram for malignant neoplasm of breast: Secondary | ICD-10-CM

## 2016-09-11 ENCOUNTER — Ambulatory Visit
Admission: RE | Admit: 2016-09-11 | Discharge: 2016-09-11 | Disposition: A | Discharge status: Home / Self Care | Payer: Managed Care, Other (non HMO) | Source: Ambulatory Visit | Attending: Obstetrics and Gynecology | Admitting: Obstetrics and Gynecology

## 2016-09-11 DIAGNOSIS — Z1231 Encounter for screening mammogram for malignant neoplasm of breast: Secondary | ICD-10-CM

## 2017-09-22 ENCOUNTER — Other Ambulatory Visit: Payer: Self-pay | Admitting: Obstetrics and Gynecology

## 2017-09-22 DIAGNOSIS — Z1231 Encounter for screening mammogram for malignant neoplasm of breast: Secondary | ICD-10-CM

## 2017-10-23 ENCOUNTER — Ambulatory Visit
Admission: RE | Admit: 2017-10-23 | Discharge: 2017-10-23 | Disposition: A | Discharge status: Home / Self Care | Payer: Managed Care, Other (non HMO) | Source: Ambulatory Visit | Attending: Obstetrics and Gynecology | Admitting: Obstetrics and Gynecology

## 2017-10-23 DIAGNOSIS — Z1231 Encounter for screening mammogram for malignant neoplasm of breast: Secondary | ICD-10-CM

## 2018-11-04 ENCOUNTER — Other Ambulatory Visit: Payer: Self-pay | Admitting: Obstetrics and Gynecology

## 2018-11-04 DIAGNOSIS — Z1231 Encounter for screening mammogram for malignant neoplasm of breast: Secondary | ICD-10-CM

## 2018-12-17 ENCOUNTER — Other Ambulatory Visit: Payer: Self-pay

## 2018-12-17 ENCOUNTER — Ambulatory Visit
Admission: RE | Admit: 2018-12-17 | Discharge: 2018-12-17 | Disposition: A | Discharge status: Home / Self Care | Payer: Managed Care, Other (non HMO) | Source: Ambulatory Visit | Attending: Obstetrics and Gynecology | Admitting: Obstetrics and Gynecology

## 2018-12-17 DIAGNOSIS — Z1231 Encounter for screening mammogram for malignant neoplasm of breast: Secondary | ICD-10-CM

## 2019-05-21 ENCOUNTER — Emergency Department (HOSPITAL_BASED_OUTPATIENT_CLINIC_OR_DEPARTMENT_OTHER): Payer: Managed Care, Other (non HMO)

## 2019-05-21 ENCOUNTER — Emergency Department (HOSPITAL_BASED_OUTPATIENT_CLINIC_OR_DEPARTMENT_OTHER)
Admission: EM | Admit: 2019-05-21 | Discharge: 2019-05-21 | Disposition: A | Discharge status: Home / Self Care | Payer: Managed Care, Other (non HMO) | Attending: Emergency Medicine | Admitting: Emergency Medicine

## 2019-05-21 ENCOUNTER — Encounter (HOSPITAL_BASED_OUTPATIENT_CLINIC_OR_DEPARTMENT_OTHER): Payer: Self-pay | Admitting: Emergency Medicine

## 2019-05-21 DIAGNOSIS — Z87891 Personal history of nicotine dependence: Secondary | ICD-10-CM | POA: Insufficient documentation

## 2019-05-21 DIAGNOSIS — M25551 Pain in right hip: Secondary | ICD-10-CM | POA: Diagnosis present

## 2019-05-21 DIAGNOSIS — M5441 Lumbago with sciatica, right side: Secondary | ICD-10-CM | POA: Diagnosis not present

## 2019-05-21 DIAGNOSIS — Z79899 Other long term (current) drug therapy: Secondary | ICD-10-CM | POA: Diagnosis not present

## 2019-05-21 DIAGNOSIS — M5431 Sciatica, right side: Secondary | ICD-10-CM

## 2019-05-21 MED ORDER — DEXAMETHASONE SODIUM PHOSPHATE 10 MG/ML IJ SOLN
10.0000 mg | Freq: Once | INTRAMUSCULAR | Status: AC
  Administered 2019-05-21: 10 mg via INTRAMUSCULAR
  Filled 2019-05-21: qty 1

## 2019-05-21 MED ORDER — HYDROCODONE-ACETAMINOPHEN 5-325 MG PO TABS: 1.0000 | ORAL_TABLET | Freq: Four times a day (QID) | ORAL | 0 refills | Status: DC | PRN

## 2019-05-21 NOTE — ED Triage Notes (Signed)
Pt reports right side hip pain x 2 weeks, with worsening overnight. Pt denies injury. Endorses otc advil 5 hrs pta with mild relief

## 2019-05-21 NOTE — ED Provider Notes (Signed)
[place] EMERGENCY DEPARTMENT Provider Note   CSN: [id number] Arrival date & time: 05/21/19  1741     History Chief Complaint  Patient presents with  . Hip Pain    Hannah Chapman is a 54 y.o. female.  Patient is a 54 year old female who presents with right hip pain.  She says is been aching for about 2 weeks but got worse overnight where she could not sleep very well.  She has been using over-the-counter medications without improvement in symptoms.  On the lateral aspect her hip as well as the posterior hip.  She has some radiation down into her thigh but no radiation past her knee.  No numbness or weakness to her extremities.  No fevers.  No recent trauma.  She says it is worse when she is up walking on it and a little bit better when she is resting.  She does have some pain starting in her right lower back and going down into her posterior hip.  No loss of bowel or bladder control.        Past Medical History:  Diagnosis Date  . Anxiety   . Complication of anesthesia   . PONV (postoperative nausea and vomiting)     Patient Active Problem List   Diagnosis Date Noted  . Incisional hernia 04/26/2015    Past Surgical History:  Procedure Laterality Date  . ABDOMINAL HYSTERECTOMY    . APPENDECTOMY    . BIOPSY THYROID     neg.  Fatima Blank HERNIA REPAIR  04/26/2015   laproscopic   . INCISIONAL HERNIA REPAIR N/A 04/26/2015   Procedure: LAPAROSCOPIC INCISIONAL HERNIA ;  Surgeon: Erroll Luna, MD;  Location: [place];  Service: General;  Laterality: N/A;  . INSERTION OF MESH N/A 04/26/2015   Procedure: INSERTION OF MESH;  Surgeon: Erroll Luna, MD;  Location: [place];  Service: General;  Laterality: N/A;     OB History   No obstetric history on file.     Family History  Problem Relation Age of Onset  . Breast cancer Mother   . Breast cancer Sister     Social History   Tobacco Use  . Smoking status: Former Smoker    Packs/day: 0.50    Years: 34.00   Pack years: 17.00    Types: Cigarettes  . Smokeless tobacco: Never Used  Substance Use Topics  . Alcohol use: Yes    Alcohol/week: 5.0 standard drinks    Types: 5 Glasses of wine per week    Comment: occ  . Drug use: No    Home Medications Prior to Admission medications   Medication Sig Start Date End Date Taking? Authorizing Provider  estrogens, conjugated, (PREMARIN) 0.625 MG tablet Take 0.625 mg by mouth daily.    [provider]  HYDROcodone-acetaminophen (NORCO/VICODIN) 5-325 MG tablet Take 1-2 tablets by mouth every 6 (six) hours as needed. 05/21/19   Malvin Johns, MD  methocarbamol (ROBAXIN) 500 MG tablet Take 1 tablet (500 mg total) by mouth every 6 (six) hours as needed for muscle spasms. 04/27/15   Erroll Luna, MD  methylPREDNISolone (MEDROL DOSEPAK) 4 MG TBPK tablet Take tapering dose per package instructions starting Sunday, January 22. 05/05/15   Molpus, Jenny Reichmann, MD  ondansetron (ZOFRAN-ODT) 4 MG disintegrating tablet Take 1 tablet (4 mg total) by mouth every 6 (six) hours as needed for nausea. 04/27/15   Cornett, Marcello Moores, MD  oxyCODONE (OXY IR/ROXICODONE) 5 MG immediate release tablet Take 1-2 tablets (5-10 mg total) by mouth  every 4 (four) hours as needed for moderate pain. 04/27/15   Cornett, Maisie Fus, MD  polyethylene glycol (MIRALAX / GLYCOLAX) packet Take 17 g by mouth daily as needed for mild constipation. 04/27/15   Harriette Bouillon, MD    Allergies    Sulfa antibiotics  Review of Systems   Review of Systems  Constitutional: Negative for fever.  Gastrointestinal: Negative for nausea and vomiting.  Musculoskeletal: Positive for arthralgias and back pain. Negative for joint swelling and neck pain.  Skin: Negative for wound.  Neurological: Negative for weakness, numbness and headaches.    Physical Exam Updated Vital Signs BP (!) 156/93 (BP Location: Right Arm)   Pulse 85   Temp 97.9 F (36.6 C) (Oral)   Resp 18   Ht 5\' 1"  (1.549 m)   Wt 89.8 kg   SpO2  98%   BMI 37.41 kg/m   Physical Exam Constitutional:      Appearance: She is well-developed.  HENT:     Head: Normocephalic and atraumatic.  Cardiovascular:     Rate and Rhythm: Normal rate.  Pulmonary:     Effort: Pulmonary effort is normal.  Musculoskeletal:        General: Tenderness present.     Cervical back: Normal range of motion and neck supple.     Comments: Patient has tenderness prominently over the right sciatic nerve.  There are some mild tenderness to the right paraspinal area of her lower back but the main pain is concentrated over her right sciatic nerve.  There are some mild tenderness on palpation of the lateral right hip and mild pain on range of motion of the hip.  No pain to the knee or ankle.  Pedal pulses are intact.  She has normal sensation and motor function distally.  Negative straight leg raise.  Skin:    General: Skin is warm and dry.  Neurological:     Mental Status: She is alert and oriented to person, place, and time.     ED Results / Procedures / Treatments   Labs (all labs ordered are listed, but only abnormal results are displayed) Labs Reviewed - No data to display  EKG None  Radiology DG Hip Unilat W or Wo Pelvis 2-3 Views Right  Result Date: 05/21/2019 CLINICAL DATA:  Right-sided hip pain for 2 weeks. EXAM: DG HIP (WITH OR WITHOUT PELVIS) 2-3V RIGHT COMPARISON:  None. FINDINGS: No evidence of an acute fracture. SI joints and symphysis pubis unremarkable. Joint space in the right hip is relatively well preserved without substantial degenerative change. Small ossific fragment adjacent to the greater trochanter was present on but has progressed since a CT scan of 02/16/2015. IMPRESSION: Negative. Electronically Signed   By: 13/07/2014 M.D.   On: 05/21/2019 18:40    Procedures Procedures (including critical care time)  Medications Ordered in ED Medications  dexamethasone (DECADRON) injection 10 mg (has no administration in time range)     ED Course  I have reviewed the triage vital signs and the nursing notes.  Pertinent labs & imaging results that were available during my care of the patient were reviewed by me and considered in my medical decision making (see chart for details).    MDM Rules/Calculators/A&P                     Patient is a 54 year old female who presents with pain in the posterior aspect of her right hip.  It seems to be concentrated over the right  sciatic nerve.  She has no numbness or weakness to extremity.  No other neurologic deficits.  No suggestions of cauda equina.  Her x-ray shows no acute abnormality.  She was given a shot of Decadron in the ED.  She was given a short course of Vicodin for pain control.  She was encouraged to follow-up with her PCP if her symptoms are not improving or return here as needed if she has any worsening symptoms. Final Clinical Impression(s) / ED Diagnoses Final diagnoses:  Sciatica of right side    Rx / DC Orders ED Discharge Orders         Ordered    HYDROcodone-acetaminophen (NORCO/VICODIN) 5-325 MG tablet  Every 6 hours PRN     05/21/19 1906           Rolan Bucco, MD 05/21/19 1907

## 2020-02-20 ENCOUNTER — Other Ambulatory Visit: Payer: Self-pay | Admitting: Obstetrics and Gynecology

## 2020-02-20 DIAGNOSIS — Z1231 Encounter for screening mammogram for malignant neoplasm of breast: Secondary | ICD-10-CM

## 2020-05-21 ENCOUNTER — Ambulatory Visit: Payer: Managed Care, Other (non HMO)

## 2020-07-06 ENCOUNTER — Other Ambulatory Visit: Payer: Self-pay

## 2020-07-06 ENCOUNTER — Ambulatory Visit
Admission: RE | Admit: 2020-07-06 | Discharge: 2020-07-06 | Disposition: A | Discharge status: Home / Self Care | Payer: Managed Care, Other (non HMO) | Source: Ambulatory Visit | Attending: Obstetrics and Gynecology | Admitting: Obstetrics and Gynecology

## 2020-07-06 DIAGNOSIS — Z1231 Encounter for screening mammogram for malignant neoplasm of breast: Secondary | ICD-10-CM

## 2021-02-10 ENCOUNTER — Emergency Department (HOSPITAL_BASED_OUTPATIENT_CLINIC_OR_DEPARTMENT_OTHER)
Admission: EM | Admit: 2021-02-10 | Discharge: 2021-02-10 | Disposition: A | Discharge status: Home / Self Care | Payer: Managed Care, Other (non HMO) | Attending: Emergency Medicine | Admitting: Emergency Medicine

## 2021-02-10 ENCOUNTER — Other Ambulatory Visit: Payer: Self-pay

## 2021-02-10 ENCOUNTER — Encounter (HOSPITAL_BASED_OUTPATIENT_CLINIC_OR_DEPARTMENT_OTHER): Payer: Self-pay

## 2021-02-10 DIAGNOSIS — Z87891 Personal history of nicotine dependence: Secondary | ICD-10-CM | POA: Diagnosis not present

## 2021-02-10 DIAGNOSIS — R220 Localized swelling, mass and lump, head: Secondary | ICD-10-CM | POA: Insufficient documentation

## 2021-02-10 DIAGNOSIS — R000 Tachycardia, unspecified: Secondary | ICD-10-CM | POA: Insufficient documentation

## 2021-02-10 DIAGNOSIS — L509 Urticaria, unspecified: Secondary | ICD-10-CM | POA: Diagnosis not present

## 2021-02-10 HISTORY — DX: Urticaria, unspecified: L50.9

## 2021-02-10 HISTORY — DX: Allergy, unspecified, initial encounter: T78.40XA

## 2021-02-10 MED ORDER — FAMOTIDINE IN NACL 20-0.9 MG/50ML-% IV SOLN
20.0000 mg | Freq: Once | INTRAVENOUS | Status: AC
  Administered 2021-02-10: 20 mg via INTRAVENOUS
  Filled 2021-02-10: qty 50

## 2021-02-10 MED ORDER — DIPHENHYDRAMINE HCL 50 MG/ML IJ SOLN
25.0000 mg | Freq: Once | INTRAMUSCULAR | Status: AC
  Administered 2021-02-10: 25 mg via INTRAVENOUS
  Filled 2021-02-10: qty 1

## 2021-02-10 MED ORDER — METHYLPREDNISOLONE SODIUM SUCC 125 MG IJ SOLR
125.0000 mg | Freq: Once | INTRAMUSCULAR | Status: AC
  Administered 2021-02-10: 125 mg via INTRAVENOUS
  Filled 2021-02-10: qty 2

## 2021-02-10 MED ORDER — EPINEPHRINE 0.3 MG/0.3ML IJ SOAJ
0.3000 mg | Freq: Once | INTRAMUSCULAR | Status: AC
  Administered 2021-02-10: 0.3 mg via INTRAMUSCULAR
  Filled 2021-02-10: qty 0.3

## 2021-02-10 NOTE — ED Provider Notes (Signed)
  Provider Note MRN:  [id number]  Arrival date & time: 02/10/21    ED Course and Medical Decision Making  Assumed care from Dr Madilyn Hook at shift change.  See her note for further information. In brief pt with chronic urticaria, was off medication briefly 2/2 cost/insurance issues and had recurrence of urticaria. Symptoms included dysphagia today without stridor/drooling and was given epinephrine. Symptoms improved. Planning to observe until 0915 to monitor for reoccurrence of symptoms. Pt has epi pen, antihistamine at home, follows with allergy.  9:17 AM Patient feels she is at her baseline.  She is tolerant oral intake with difficulty, no dysphagia or dysphonia.  She feels her hives have resolved.  Patient stable for discharge this time with close outpatient follow-up with allergist, PCP.  Has EpiPen at home.  She has antihistamine at home. D/c plan per Dr Madilyn Hook.  The patient improved significantly and was discharged in stable condition. Detailed discussions were had with the patient regarding current findings, and need for close f/u with PCP or on call doctor. The patient has been instructed to return immediately if the symptoms worsen in any way for re-evaluation. Patient verbalized understanding and is in agreement with current care plan. All questions answered prior to discharge.    Final Clinical Impressions(s) / ED Diagnoses     ICD-10-CM   1. Urticaria  L50.9       ED Discharge Orders     None       Discharge Instructions   None      Sloan Leiter, DO 02/10/21 5809

## 2021-02-10 NOTE — ED Notes (Signed)
Pt states that throat and nose swelling has improved and she is already feeling a difference.

## 2021-02-10 NOTE — ED Provider Notes (Signed)
[facility] EMERGENCY DEPT Provider Note   CSN: [id number] Arrival date & time: 02/10/21  9563     History Chief Complaint  Patient presents with   Facial Swelling   Urticaria    Hannah Chapman is a 55 y.o. female.  The history is provided by the patient and medical records.  Urticaria Hannah Chapman is a 55 y.o. female who presents to the Emergency Department complaining of hives and difficulty breathing. She presents the emergency department accompanied by her husband for evaluation of urticaria will rash and difficulty breathing. She has a history of chronic urticaria that started about one year ago. About four months ago she started on solar injections twice weekly and was doing significantly better. She was unable to obtain injections for about 4 to 6 weeks due to insurance not covering the medication. On Thursday she was able to receive an additional dose of her Xolair. While on the medication her hives were well controlled. On Monday of this last week her hives had returned. On Friday her hives had worsened and she presented to the emergency department and received a shot of steroids. Her hives temporarily improved after the steroids. On Saturday night her highest returned. About three hours ago she developed a sensation of throat swelling and nasal occlusion. She can breathe well but feels like it is very difficult and challenging to swallow. She also states that her hives are worsening. She has been taking Allegra daily for the hives and itching. Symptoms are moderate, constant, worsening. No new medications. No recent illnesses. She has been followed by an allergist regarding her urticaria.     Past Medical History:  Diagnosis Date   Allergies    Anxiety    Complication of anesthesia    Hives    chronic   PONV (postoperative nausea and vomiting)     Patient Active Problem List   Diagnosis Date Noted   Incisional hernia 04/26/2015    Past Surgical  History:  Procedure Laterality Date   ABDOMINAL HYSTERECTOMY     APPENDECTOMY     BIOPSY THYROID     neg.   INCISIONAL HERNIA REPAIR  04/26/2015   laproscopic    INCISIONAL HERNIA REPAIR N/A 04/26/2015   Procedure: LAPAROSCOPIC INCISIONAL HERNIA ;  Surgeon: Harriette Bouillon, MD;  Location: [facility] OR;  Service: General;  Laterality: N/A;   INSERTION OF MESH N/A 04/26/2015   Procedure: INSERTION OF MESH;  Surgeon: Harriette Bouillon, MD;  Location: [facility] OR;  Service: General;  Laterality: N/A;     OB History   No obstetric history on file.     Family History  Problem Relation Age of Onset   Breast cancer Mother    Breast cancer Sister     Social History   Tobacco Use   Smoking status: Former    Packs/day: 0.50    Years: 34.00    Pack years: 17.00    Types: Cigarettes   Smokeless tobacco: Never  Substance Use Topics   Alcohol use: Yes    Alcohol/week: 5.0 standard drinks    Types: 5 Glasses of wine per week    Comment: occ   Drug use: No    Home Medications Prior to Admission medications   Medication Sig Start Date End Date Taking? Authorizing Provider  estrogens, conjugated, (PREMARIN) 0.625 MG tablet Take 0.625 mg by mouth daily.   Yes [provider]  HYDROcodone-acetaminophen (NORCO/VICODIN) 5-325 MG tablet Take 1-2 tablets by mouth every 6 (six) hours  as needed. 05/21/19   Rolan Bucco, MD  methocarbamol (ROBAXIN) 500 MG tablet Take 1 tablet (500 mg total) by mouth every 6 (six) hours as needed for muscle spasms. 04/27/15   Harriette Bouillon, MD  methylPREDNISolone (MEDROL DOSEPAK) 4 MG TBPK tablet Take tapering dose per package instructions starting Sunday, January 22. 05/05/15   Molpus, Jonny Ruiz, MD  ondansetron (ZOFRAN-ODT) 4 MG disintegrating tablet Take 1 tablet (4 mg total) by mouth every 6 (six) hours as needed for nausea. 04/27/15   Cornett, Maisie Fus, MD  oxyCODONE (OXY IR/ROXICODONE) 5 MG immediate release tablet Take 1-2 tablets (5-10 mg total) by mouth every 4 (four)  hours as needed for moderate pain. 04/27/15   Cornett, Maisie Fus, MD  polyethylene glycol (MIRALAX / GLYCOLAX) packet Take 17 g by mouth daily as needed for mild constipation. 04/27/15   Cornett, Maisie Fus, MD    Allergies    Sulfa antibiotics  Review of Systems   Review of Systems  All other systems reviewed and are negative.  Physical Exam Updated Vital Signs BP 140/83   Pulse 94   Temp 98 F (36.7 C) (Oral)   Resp (!) 24   Ht 5\' 1"  (1.549 m)   Wt 95.3 kg   SpO2 96%   BMI 39.68 kg/m   Physical Exam Vitals and nursing note reviewed.  Constitutional:      Appearance: She is well-developed.  HENT:     Head: Normocephalic and atraumatic.     Comments: Unable to visualize the posterior oropharynx well. There is no significant tongue edema. Visualized portions of the soft palate are without edema or erythema.    Mouth/Throat:     Mouth: Mucous membranes are moist.  Cardiovascular:     Rate and Rhythm: Regular rhythm. Tachycardia present.     Heart sounds: No murmur heard. Pulmonary:     Effort: Pulmonary effort is normal. No respiratory distress.     Breath sounds: Normal breath sounds. No stridor.  Abdominal:     Palpations: Abdomen is soft.     Tenderness: There is no abdominal tenderness. There is no guarding or rebound.  Musculoskeletal:        General: No tenderness.  Skin:    General: Skin is warm and dry.     Comments: Generalized urticaria. Most lesions are approximately 1 cm in diameter. She has lesions to the face, trunk, extremities. Urticaria are concentrated in the axillary region as well as along her bra line.    Neurological:     Mental Status: She is alert and oriented to person, place, and time.  Psychiatric:        Behavior: Behavior normal.    ED Results / Procedures / Treatments   Labs (all labs ordered are listed, but only abnormal results are displayed) Labs Reviewed - No data to display  EKG None  Radiology No results  found.  Procedures Procedures  CRITICAL CARE Performed by:   Total critical care time: 35 minutes  Critical care time was exclusive of separately billable procedures and treating other patients.  Critical care was necessary to treat or prevent imminent or life-threatening deterioration.  Critical care was time spent personally by me on the following activities: development of treatment plan with patient and/or surrogate as well as nursing, discussions with consultants, evaluation of patient's response to treatment, examination of patient, obtaining history from patient or surrogate, ordering and performing treatments and interventions, ordering and review of laboratory studies, ordering and review of radiographic studies,  pulse oximetry and re-evaluation of patient's condition.  Medications Ordered in ED Medications  diphenhydrAMINE (BENADRYL) injection 25 mg (has no administration in time range)  EPINEPHrine (EPI-PEN) injection 0.3 mg (0.3 mg Intramuscular Given 02/10/21 0516)  diphenhydrAMINE (BENADRYL) injection 25 mg (25 mg Intravenous Given 02/10/21 0517)  methylPREDNISolone sodium succinate (SOLU-MEDROL) 125 mg/2 mL injection 125 mg (125 mg Intravenous Given 02/10/21 0517)  famotidine (PEPCID) IVPB 20 mg premix (0 mg Intravenous Stopped 02/10/21 0537)    ED Course  I have reviewed the triage vital signs and the nursing notes.  Pertinent labs & imaging results that were available during my care of the patient were reviewed by me and considered in my medical decision making (see chart for details).    MDM Rules/Calculators/A&P                         patient with history of chronic urticaria here for evaluation of recurrent urticaria with associated difficulty swallowing and difficulty breathing through her nose. On assessment she is tachycardic with generalized urticaria. She has no respiratory distress or stridor. Given sensation of airway swelling will treat  with epinephrine for symptom management.   On reassessment after epi-administration patient feels like her breathing, swallowing issues have resolved. She does have improvement in her hives but does have some persistent urticaria to the right neck and persistent itching. Will provide additional dose of Benadryl. Patient care transferred pending reassessment.   Final Clinical Impression(s) / ED Diagnoses Final diagnoses:  Urticaria    Rx / DC Orders ED Discharge Orders     None        Tilden Fossa, MD 02/10/21 [phone]

## 2021-02-10 NOTE — ED Triage Notes (Addendum)
Pt is present for facial swelling and feeling like her throat is beginning to swell. Pt noticed the facial swelling four hours ago. Pt states that her throat continues to progressively swell and it is becoming more difficult to swallow. Pt has also had hives over the last few weeks and was allergy tested. Pt also had a steriod shot on Friday for the recurrent hives. Pt has not started any new medications and unsure what she may have had a reaction to. Hives noted to the trunk, axilla, back, and bilateral legs.

## 2021-02-10 NOTE — Discharge Instructions (Addendum)
Please follow up with allergist.

## 2021-02-10 NOTE — ED Notes (Signed)
Dc instructions reviewed with patient. Patient voiced understanding.dc with belongings

## 2021-07-08 ENCOUNTER — Other Ambulatory Visit: Payer: Self-pay | Admitting: Obstetrics and Gynecology

## 2021-07-08 ENCOUNTER — Other Ambulatory Visit: Payer: Self-pay | Admitting: Physician Assistant

## 2021-07-08 DIAGNOSIS — Z1231 Encounter for screening mammogram for malignant neoplasm of breast: Secondary | ICD-10-CM

## 2021-07-26 ENCOUNTER — Ambulatory Visit: Payer: Managed Care, Other (non HMO)

## 2021-08-09 ENCOUNTER — Ambulatory Visit
Admission: RE | Admit: 2021-08-09 | Discharge: 2021-08-09 | Disposition: A | Discharge status: Home / Self Care | Payer: Managed Care, Other (non HMO) | Source: Ambulatory Visit | Attending: Physician Assistant | Admitting: Physician Assistant

## 2021-08-09 DIAGNOSIS — Z1231 Encounter for screening mammogram for malignant neoplasm of breast: Secondary | ICD-10-CM

## 2021-08-28 ENCOUNTER — Other Ambulatory Visit: Payer: Self-pay

## 2021-08-28 ENCOUNTER — Emergency Department (HOSPITAL_BASED_OUTPATIENT_CLINIC_OR_DEPARTMENT_OTHER)
Admission: EM | Admit: 2021-08-28 | Discharge: 2021-08-28 | Disposition: A | Discharge status: Home / Self Care | Payer: Managed Care, Other (non HMO) | Attending: Emergency Medicine | Admitting: Emergency Medicine

## 2021-08-28 ENCOUNTER — Encounter (HOSPITAL_BASED_OUTPATIENT_CLINIC_OR_DEPARTMENT_OTHER): Payer: Self-pay

## 2021-08-28 DIAGNOSIS — R21 Rash and other nonspecific skin eruption: Secondary | ICD-10-CM | POA: Diagnosis present

## 2021-08-28 DIAGNOSIS — L509 Urticaria, unspecified: Secondary | ICD-10-CM | POA: Insufficient documentation

## 2021-08-28 MED ORDER — DIPHENHYDRAMINE HCL 50 MG/ML IJ SOLN
25.0000 mg | Freq: Once | INTRAMUSCULAR | Status: AC
  Administered 2021-08-28: 25 mg via INTRAVENOUS
  Filled 2021-08-28: qty 1

## 2021-08-28 MED ORDER — FAMOTIDINE IN NACL 20-0.9 MG/50ML-% IV SOLN
20.0000 mg | Freq: Once | INTRAVENOUS | Status: AC
  Administered 2021-08-28: 20 mg via INTRAVENOUS
  Filled 2021-08-28: qty 50

## 2021-08-28 MED ORDER — PREDNISONE 10 MG PO TABS: 20.0000 mg | ORAL_TABLET | Freq: Two times a day (BID) | ORAL | 0 refills | Status: DC

## 2021-08-28 MED ORDER — METHYLPREDNISOLONE SODIUM SUCC 125 MG IJ SOLR
125.0000 mg | Freq: Once | INTRAMUSCULAR | Status: AC
  Administered 2021-08-28: 125 mg via INTRAVENOUS
  Filled 2021-08-28: qty 2

## 2021-08-28 MED ORDER — EPINEPHRINE 0.3 MG/0.3ML IJ SOAJ
0.3000 mg | Freq: Once | INTRAMUSCULAR | Status: AC
  Administered 2021-08-28: 0.3 mg via INTRAMUSCULAR
  Filled 2021-08-28: qty 0.3

## 2021-08-28 NOTE — Discharge Instructions (Signed)
Begin taking prednisone as prescribed. ? ?Take Benadryl 25 mg every 6 hours for the next 3 days. ? ?Return to the emergency department if symptoms significantly worsen or change. ?

## 2021-08-28 NOTE — ED Provider Notes (Signed)
?[facility] EMERGENCY DEPT ?Provider Note ? ? ?CSN: [id number] ?Arrival date & time: 08/28/21  0263 ? ?  ? ?History ? ?Chief Complaint  ?Patient presents with  ? Angioedema  ? ? ?Hannah Chapman is a 56 y.o. female. ? ?Patient is a 56 year old female with past medical history of chronic urticaria followed by Dr. Gary Fleet.  Patient presenting today with complaints of facial swelling and rash.  This started earlier this evening in the absence of any known exposure or trigger.  She describes swelling to her face, mainly her upper lip and welts to the torso, arms, and legs.  She denies any aggravating or alleviating factors.  Patient has been here in the past with similar complaints requiring IV steroids and epinephrine. ? ?The history is provided by the patient.  ? ?  ? ?Home Medications ?Prior to Admission medications   ?Medication Sig Start Date End Date Taking? Authorizing Provider  ?estrogens, conjugated, (PREMARIN) 0.625 MG tablet Take 0.625 mg by mouth daily.    [provider]  ?HYDROcodone-acetaminophen (NORCO/VICODIN) 5-325 MG tablet Take 1-2 tablets by mouth every 6 (six) hours as needed. 05/21/19   Rolan Bucco, MD  ?methocarbamol (ROBAXIN) 500 MG tablet Take 1 tablet (500 mg total) by mouth every 6 (six) hours as needed for muscle spasms. 04/27/15   Harriette Bouillon, MD  ?methylPREDNISolone (MEDROL DOSEPAK) 4 MG TBPK tablet Take tapering dose per package instructions starting Sunday, January 22. 05/05/15   Molpus, Jonny Ruiz, MD  ?ondansetron (ZOFRAN-ODT) 4 MG disintegrating tablet Take 1 tablet (4 mg total) by mouth every 6 (six) hours as needed for nausea. 04/27/15   Cornett, Maisie Fus, MD  ?oxyCODONE (OXY IR/ROXICODONE) 5 MG immediate release tablet Take 1-2 tablets (5-10 mg total) by mouth every 4 (four) hours as needed for moderate pain. 04/27/15   Cornett, Maisie Fus, MD  ?polyethylene glycol (MIRALAX / GLYCOLAX) packet Take 17 g by mouth daily as needed for mild constipation. 04/27/15   Harriette Bouillon, MD  ?   ? ?Allergies    ?Sulfa antibiotics   ? ?Review of Systems   ?Review of Systems  ?All other systems reviewed and are negative. ? ?Physical Exam ?Updated Vital Signs ?BP (!) 141/96   Pulse (!) 104   Temp 98.3 ?F (36.8 ?C) (Oral)   Resp 20   SpO2 96%  ?Physical Exam ?Vitals and nursing note reviewed.  ?Constitutional:   ?   General: She is not in acute distress. ?   Appearance: She is well-developed. She is not diaphoretic.  ?HENT:  ?   Head: Normocephalic and atraumatic.  ?   Mouth/Throat:  ?   Comments: There is swelling of the upper lip noted extending to the cheeks and nose. ?Cardiovascular:  ?   Rate and Rhythm: Normal rate and regular rhythm.  ?   Heart sounds: No murmur heard. ?  No friction rub. No gallop.  ?Pulmonary:  ?   Effort: Pulmonary effort is normal. No respiratory distress.  ?   Breath sounds: Normal breath sounds. No wheezing.  ?Abdominal:  ?   General: Bowel sounds are normal. There is no distension.  ?   Palpations: Abdomen is soft.  ?   Tenderness: There is no abdominal tenderness.  ?Musculoskeletal:     ?   General: Normal range of motion.  ?   Cervical back: Normal range of motion and neck supple. No rigidity or tenderness.  ?Skin: ?   General: Skin is warm and dry.  ?  Comments: There is an urticarial rash noted to the arms, legs, and torso.  ?Neurological:  ?   General: No focal deficit present.  ?   Mental Status: She is alert and oriented to person, place, and time.  ? ? ?ED Results / Procedures / Treatments   ?Labs ?(all labs ordered are listed, but only abnormal results are displayed) ?Labs Reviewed - No data to display ? ?EKG ?None ? ?Radiology ?No results found. ? ?Procedures ?Procedures  ? ? ?Medications Ordered in ED ?Medications  ?EPINEPHrine (EPI-PEN) injection 0.3 mg (has no administration in time range)  ?famotidine (PEPCID) IVPB 20 mg premix (has no administration in time range)  ?methylPREDNISolone sodium succinate (SOLU-MEDROL) 125 mg/2 mL injection 125 mg  (has no administration in time range)  ?diphenhydrAMINE (BENADRYL) injection 25 mg (has no administration in time range)  ? ? ?ED Course/ Medical Decision Making/ A&P ? ?Patient presenting here with complaints of swelling to her face and rash to her arms, legs, and torso.  She has history of chronic urticaria and this appears to be a flareup of this.  She has been in the ER before with similar complaints.  Patient given EpiPen along with Solu-Medrol and Benadryl.  Her swelling has markedly improved and the rash has also improved.  I feel as though patient can safely be discharged.  The exact etiology of her reaction unclear, however she is following up with an allergist. ? ?Final Clinical Impression(s) / ED Diagnoses ?Final diagnoses:  ?None  ? ? ?Rx / DC Orders ?ED Discharge Orders   ? ? None  ? ?  ? ? ?  ?Geoffery Lyons, MD ?08/28/21 0559 ? ?

## 2021-08-28 NOTE — ED Notes (Signed)
Pt verbalizes understanding of discharge instructions. Opportunity for questioning and answers were provided. Pt discharged from ED to home.   ? ?

## 2021-08-28 NOTE — ED Triage Notes (Signed)
Pt states that she woke up about an hour ago with hives all over and selling to the upper lip, denies SOB at this time. Pt states that hives are chronic and she is on Xolair for this.  ?

## 2022-07-25 ENCOUNTER — Other Ambulatory Visit: Payer: Self-pay | Admitting: Physician Assistant

## 2022-07-25 DIAGNOSIS — Z1231 Encounter for screening mammogram for malignant neoplasm of breast: Secondary | ICD-10-CM

## 2022-09-05 ENCOUNTER — Ambulatory Visit
Admission: RE | Admit: 2022-09-05 | Discharge: 2022-09-05 | Disposition: A | Discharge status: Home / Self Care | Payer: Managed Care, Other (non HMO) | Source: Ambulatory Visit | Attending: Physician Assistant

## 2022-09-05 DIAGNOSIS — Z1231 Encounter for screening mammogram for malignant neoplasm of breast: Secondary | ICD-10-CM

## 2023-09-28 ENCOUNTER — Emergency Department (HOSPITAL_BASED_OUTPATIENT_CLINIC_OR_DEPARTMENT_OTHER)
Admission: EM | Admit: 2023-09-28 | Discharge: 2023-09-28 | Disposition: A | Discharge status: Home / Self Care | Attending: Emergency Medicine | Admitting: Emergency Medicine

## 2023-09-28 ENCOUNTER — Other Ambulatory Visit: Payer: Self-pay

## 2023-09-28 ENCOUNTER — Encounter (HOSPITAL_BASED_OUTPATIENT_CLINIC_OR_DEPARTMENT_OTHER): Payer: Self-pay

## 2023-09-28 ENCOUNTER — Emergency Department (HOSPITAL_BASED_OUTPATIENT_CLINIC_OR_DEPARTMENT_OTHER): Admitting: Radiology

## 2023-09-28 DIAGNOSIS — I110 Hypertensive heart disease with heart failure: Secondary | ICD-10-CM | POA: Insufficient documentation

## 2023-09-28 DIAGNOSIS — R079 Chest pain, unspecified: Secondary | ICD-10-CM

## 2023-09-28 DIAGNOSIS — I5020 Unspecified systolic (congestive) heart failure: Secondary | ICD-10-CM | POA: Insufficient documentation

## 2023-09-28 DIAGNOSIS — R0789 Other chest pain: Secondary | ICD-10-CM | POA: Diagnosis present

## 2023-09-28 LAB — CBC
HCT: 39.7 % (ref 36.0–46.0)
Hemoglobin: 13.5 g/dL (ref 12.0–15.0)
MCH: 33.5 pg (ref 26.0–34.0)
MCHC: 34 g/dL (ref 30.0–36.0)
MCV: 98.5 fL (ref 80.0–100.0)
Platelets: 236 10*3/uL (ref 150–400)
RBC: 4.03 MIL/uL (ref 3.87–5.11)
RDW: 13.6 % (ref 11.5–15.5)
WBC: 7.6 10*3/uL (ref 4.0–10.5)
nRBC: 0 % (ref 0.0–0.2)

## 2023-09-28 LAB — BASIC METABOLIC PANEL WITH GFR
Anion gap: 17 — ABNORMAL HIGH (ref 5–15)
BUN: 17 mg/dL (ref 6–20)
CO2: 22 mmol/L (ref 22–32)
Calcium: 9.7 mg/dL (ref 8.9–10.3)
Chloride: 105 mmol/L (ref 98–111)
Creatinine, Ser: 0.76 mg/dL (ref 0.44–1.00)
GFR, Estimated: >60 mL/min (ref >60)
Glucose, Bld: 99 mg/dL (ref 70–99)
Potassium: 4.1 mmol/L (ref 3.5–5.1)
Sodium: 144 mmol/L (ref 135–145)

## 2023-09-28 LAB — TROPONIN T, HIGH SENSITIVITY
Troponin T High Sensitivity: <15 ng/L (ref <19)
Troponin T High Sensitivity: <15 ng/L (ref <19)

## 2023-09-28 NOTE — ED Triage Notes (Signed)
 Pt reports L sided chest pressure with numbness in left arm starting this afternoon, reports SBP 170 at home. Pt reports gradual onset of pain which is dull/achy. Pt denies any other associated symptoms. NAD noted in triage. Denies cardiac history.

## 2023-09-28 NOTE — Discharge Instructions (Signed)
 You were seen in the emerged from for chest pain Your blood work EKG and chest x-ray looked okay Your blood pressure improved after resting here in the ED We are not certain what is causing your chest pain but do not think you are having a heart attack tonight It is importantly follow-up with your primary care doctor in 1 week for reevaluation Return to the emergency room for chest pain trouble breathing or any other concerns

## 2023-09-28 NOTE — ED Provider Notes (Signed)
 [place] EMERGENCY DEPARTMENT AT [facility] Provider Note   CSN: [id number] Arrival date & time: 09/28/23  1849     Patient presents with: Chest Pain   Hannah Chapman is a 58 y.o. female.  With a history of anxiety who presents to the ED for chest pain.  Patient first experienced left-sided chest pressure while resting at home.  Pain radiated to left arm.  Measured blood pressure systolic 170 at home.  Has an aching quality.  No shortness of breath nausea vomiting fever chills recent illness.  Some prior episodes of similar chest pain in the past.  Has not been seen by cardiology for this issue    Chest Pain      Prior to Admission medications   Medication Sig Start Date End Date Taking? Authorizing Provider  estrogens, conjugated, (PREMARIN) 0.625 MG tablet Take 0.625 mg by mouth daily.    [provider]  HYDROcodone -acetaminophen  (NORCO/VICODIN) 5-325 MG tablet Take 1-2 tablets by mouth every 6 (six) hours as needed. 05/21/19   Hershel Los, MD  methocarbamol  (ROBAXIN ) 500 MG tablet Take 1 tablet (500 mg total) by mouth every 6 (six) hours as needed for muscle spasms. 04/27/15   Sim Dryer, MD  methylPREDNISolone  (MEDROL  DOSEPAK) 4 MG TBPK tablet Take tapering dose per package instructions starting Sunday, January 22. 05/05/15   Molpus, Autry Legions, MD  ondansetron  (ZOFRAN -ODT) 4 MG disintegrating tablet Take 1 tablet (4 mg total) by mouth every 6 (six) hours as needed for nausea. 04/27/15   Cornett, Andy Bannister, MD  oxyCODONE  (OXY IR/ROXICODONE ) 5 MG immediate release tablet Take 1-2 tablets (5-10 mg total) by mouth every 4 (four) hours as needed for moderate pain. 04/27/15   Cornett, Andy Bannister, MD  polyethylene glycol (MIRALAX  / GLYCOLAX ) packet Take 17 g by mouth daily as needed for mild constipation. 04/27/15   Cornett, Andy Bannister, MD  predniSONE  (DELTASONE ) 10 MG tablet Take 2 tablets (20 mg total) by mouth 2 (two) times daily with a meal. 08/28/21   Orvilla Blander, MD     Allergies: Sulfa antibiotics    Review of Systems  Cardiovascular:  Positive for chest pain.    Updated Vital Signs BP (!) 146/104   Pulse 78   Temp 98.3 F (36.8 C) (Oral)   Resp 15   Ht 5' (1.524 m)   Wt 88.5 kg   SpO2 95%   BMI 38.08 kg/m   Physical Exam Vitals and nursing note reviewed.  HENT:     Head: Normocephalic and atraumatic.   Eyes:     Pupils: Pupils are equal, round, and reactive to light.    Cardiovascular:     Rate and Rhythm: Normal rate and regular rhythm.  Pulmonary:     Effort: Pulmonary effort is normal.     Breath sounds: Normal breath sounds.  Abdominal:     Palpations: Abdomen is soft.     Tenderness: There is no abdominal tenderness.   Skin:    General: Skin is warm and dry.   Neurological:     Mental Status: She is alert.   Psychiatric:        Mood and Affect: Mood normal.     (all labs ordered are listed, but only abnormal results are displayed) Labs Reviewed  BASIC METABOLIC PANEL WITH GFR - Abnormal; Notable for the following components:      Result Value   Anion gap 17 (*)    All other components within normal limits  CBC  TROPONIN T, HIGH  SENSITIVITY  TROPONIN T, HIGH SENSITIVITY    EKG: EKG Interpretation Date/Time:  Monday September 28 2023 18:57:18 EDT Ventricular Rate:  100 PR Interval:  130 QRS Duration:  84 QT Interval:  374 QTC Calculation: 482 R Axis:   0  Text Interpretation: Normal sinus rhythm Minimal voltage criteria for LVH, may be normal variant ( R in aVL ) Prolonged QT Abnormal ECG When compared with ECG of 14-Feb-2016 12:46, PREVIOUS ECG IS PRESENT Confirmed by Rafael Bun [phone]) on 09/28/2023 9:24:50 PM  Radiology: [facility] Chest 2 View Result Date: 09/28/2023 CLINICAL DATA:  Left-sided chest pressure with numbness in the left arm. EXAM: CHEST - 2 VIEW COMPARISON:  None Available. FINDINGS: The heart size and mediastinal contours are within normal limits. Both lungs are clear. The visualized  skeletal structures are unremarkable. IMPRESSION: No active cardiopulmonary disease. Electronically Signed   By: Virgle Grime M.D.   On: 09/28/2023 19:27     Procedures   Medications Ordered in the ED - No data to display                                  Medical Decision Making 58 year old female with history as above presented to the ED for chest pain beginning while at home today.  Significantly hypertensive at home as well.  Benign physical exam.  Upon my assessment she has no normotensive.  No evidence of ACS given EKG and delta troponin which is both negative.  Electrolytes look okay.  Laboratory workup unremarkable.  Chest x-ray NAD.  Unclear cause of her chest pain here tonight but appropriate for PCP discharge  Amount and/or Complexity of Data Reviewed Labs: ordered. Radiology: ordered.        Final diagnoses:  Chest pain, unspecified type    ED Discharge Orders     None          Sallyanne Creamer, DO 09/28/23 2241

## 2023-11-14 ENCOUNTER — Other Ambulatory Visit: Payer: Self-pay

## 2023-11-14 ENCOUNTER — Emergency Department (HOSPITAL_BASED_OUTPATIENT_CLINIC_OR_DEPARTMENT_OTHER)
Admission: EM | Admit: 2023-11-14 | Discharge: 2023-11-14 | Disposition: A | Discharge status: Home / Self Care | Attending: Emergency Medicine | Admitting: Emergency Medicine

## 2023-11-14 ENCOUNTER — Emergency Department (HOSPITAL_BASED_OUTPATIENT_CLINIC_OR_DEPARTMENT_OTHER)

## 2023-11-14 DIAGNOSIS — S0083XA Contusion of other part of head, initial encounter: Secondary | ICD-10-CM | POA: Insufficient documentation

## 2023-11-14 DIAGNOSIS — Y92009 Unspecified place in unspecified non-institutional (private) residence as the place of occurrence of the external cause: Secondary | ICD-10-CM | POA: Diagnosis not present

## 2023-11-14 DIAGNOSIS — S0990XA Unspecified injury of head, initial encounter: Secondary | ICD-10-CM | POA: Diagnosis present

## 2023-11-14 DIAGNOSIS — S139XXA Sprain of joints and ligaments of unspecified parts of neck, initial encounter: Secondary | ICD-10-CM

## 2023-11-14 DIAGNOSIS — M542 Cervicalgia: Secondary | ICD-10-CM | POA: Diagnosis not present

## 2023-11-14 DIAGNOSIS — W1830XA Fall on same level, unspecified, initial encounter: Secondary | ICD-10-CM | POA: Insufficient documentation

## 2023-11-14 MED ORDER — METHOCARBAMOL 500 MG PO TABS: 500.0000 mg | ORAL_TABLET | Freq: Two times a day (BID) | ORAL | 0 refills | Status: DC

## 2023-11-14 NOTE — ED Triage Notes (Addendum)
 Patient states fall in driveway Thursday night reports neck pain that radiates to bilateral shoulders since. Also reports headache since fall

## 2023-11-14 NOTE — ED Provider Notes (Signed)
  EMERGENCY DEPARTMENT AT [facility] Provider Note   CSN: [id number] Arrival date & time: 11/14/23  9087     Patient presents with: Neck Injury   Hannah Chapman is a 58 y.o. female.   58 year old female presents with facial pain along with left-sided neck pain.  States that she had a mechanical fall 2 days ago while walking at home on her driveway.  Struck her face but did not have any LOC.  Complains of left-sided neck pain characterizes sharp and nonradiating since then.  Denies any weakness to her arms or legs.  Has had no nausea or vomiting.  No visual changes recently.  Using over-the-counter medications without relief       Prior to Admission medications   Medication Sig Start Date End Date Taking? Authorizing Provider  estrogens, conjugated, (PREMARIN) 0.625 MG tablet Take 0.625 mg by mouth daily.    [provider]  HYDROcodone -acetaminophen  (NORCO/VICODIN) 5-325 MG tablet Take 1-2 tablets by mouth every 6 (six) hours as needed. 05/21/19   Lenor Hollering, MD  methocarbamol  (ROBAXIN ) 500 MG tablet Take 1 tablet (500 mg total) by mouth every 6 (six) hours as needed for muscle spasms. 04/27/15   Vanderbilt Ned, MD  methylPREDNISolone  (MEDROL  DOSEPAK) 4 MG TBPK tablet Take tapering dose per package instructions starting Sunday, January 22. 05/05/15   Molpus, Norleen, MD  ondansetron  (ZOFRAN -ODT) 4 MG disintegrating tablet Take 1 tablet (4 mg total) by mouth every 6 (six) hours as needed for nausea. 04/27/15   Cornett, Ned, MD  oxyCODONE  (OXY IR/ROXICODONE ) 5 MG immediate release tablet Take 1-2 tablets (5-10 mg total) by mouth every 4 (four) hours as needed for moderate pain. 04/27/15   Cornett, Ned, MD  polyethylene glycol (MIRALAX  / GLYCOLAX ) packet Take 17 g by mouth daily as needed for mild constipation. 04/27/15   Vanderbilt Ned, MD  predniSONE  (DELTASONE ) 10 MG tablet Take 2 tablets (20 mg total) by mouth 2 (two) times daily with a meal. 08/28/21    Geroldine Berg, MD    Allergies: Sulfa antibiotics    Review of Systems  All other systems reviewed and are negative.   Updated Vital Signs BP (!) 166/94   Pulse 96   Temp 98.1 F (36.7 C) (Oral)   Resp 16   SpO2 94%   Physical Exam Vitals and nursing note reviewed.  Constitutional:      General: She is not in acute distress.    Appearance: Normal appearance. She is well-developed. She is not toxic-appearing.  HENT:     Head: Normocephalic and atraumatic.     Comments: Multiple facial abrasions noted Eyes:     General: Lids are normal.     Conjunctiva/sclera: Conjunctivae normal.     Pupils: Pupils are equal, round, and reactive to light.  Neck:     Thyroid : No thyroid  mass.     Trachea: No tracheal deviation.  Cardiovascular:     Rate and Rhythm: Normal rate and regular rhythm.     Heart sounds: Normal heart sounds. No murmur heard.    No gallop.  Pulmonary:     Effort: Pulmonary effort is normal. No respiratory distress.     Breath sounds: Normal breath sounds. No stridor. No decreased breath sounds, wheezing, rhonchi or rales.  Abdominal:     General: There is no distension.     Palpations: Abdomen is soft.     Tenderness: There is no abdominal tenderness. There is no rebound.  Musculoskeletal:  General: No tenderness. Normal range of motion.     Cervical back: Pain with movement, spinous process tenderness and muscular tenderness present.  Skin:    General: Skin is warm and dry.     Findings: No abrasion or rash.  Neurological:     General: No focal deficit present.     Mental Status: She is alert and oriented to person, place, and time. Mental status is at baseline.     GCS: GCS eye subscore is 4. GCS verbal subscore is 5. GCS motor subscore is 6.     Cranial Nerves: No cranial nerve deficit.     Sensory: No sensory deficit.     Motor: Motor function is intact.     Comments: Strength is 5 of 5 in upper as well as lower extremities.  Psychiatric:         Attention and Perception: Attention normal.        Speech: Speech normal.        Behavior: Behavior normal.     (all labs ordered are listed, but only abnormal results are displayed) Labs Reviewed - No data to display  EKG: None  Radiology: No results found.   Procedures   Medications Ordered in the ED - No data to display                                  Medical Decision Making Amount and/or Complexity of Data Reviewed Radiology: ordered.   CT of head and cervical spine were both negative for acute fracture.  Will discharge home     Final diagnoses:  None    ED Discharge Orders     None          Dasie Faden, MD 11/14/23 1015

## 2023-11-19 IMAGING — MG MM DIGITAL SCREENING BILAT W/ TOMO AND CAD
8 series · 8 of 24 positions shown · non-contrast
Comparison: Previous exam(s).

CLINICAL DATA: Screening.

EXAM:
DIGITAL SCREENING BILATERAL MAMMOGRAM WITH TOMOSYNTHESIS AND CAD
TECHNIQUE: Bilateral screening digital craniocaudal and mediolateral oblique
mammograms were obtained. Bilateral screening digital breast
tomosynthesis was performed. The images were evaluated with
computer-aided detection.

[L CC synth-2D]
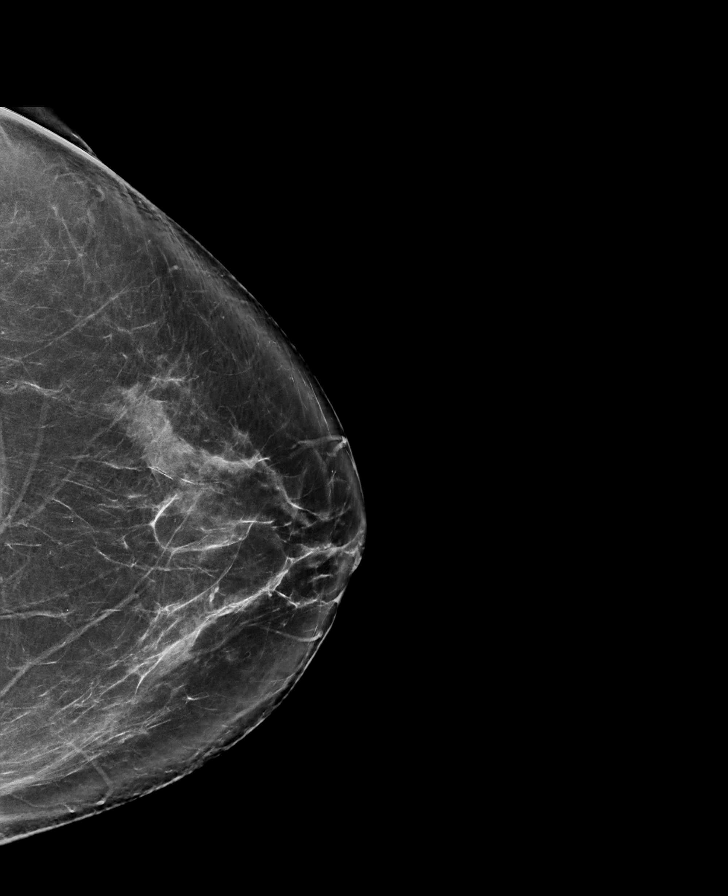

[R MLO synth-2D]
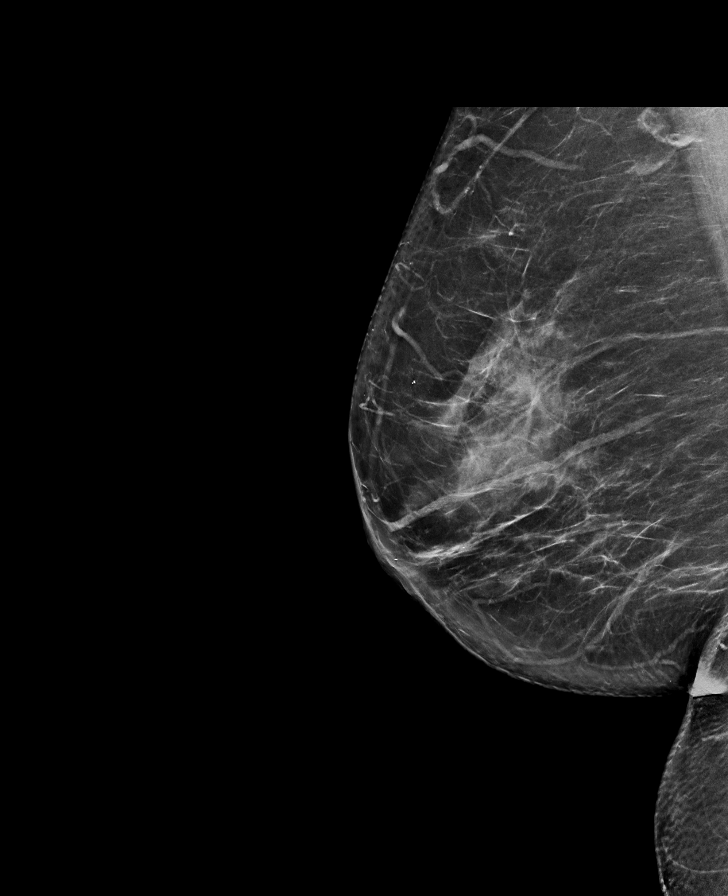

[L MLO synth-2D]
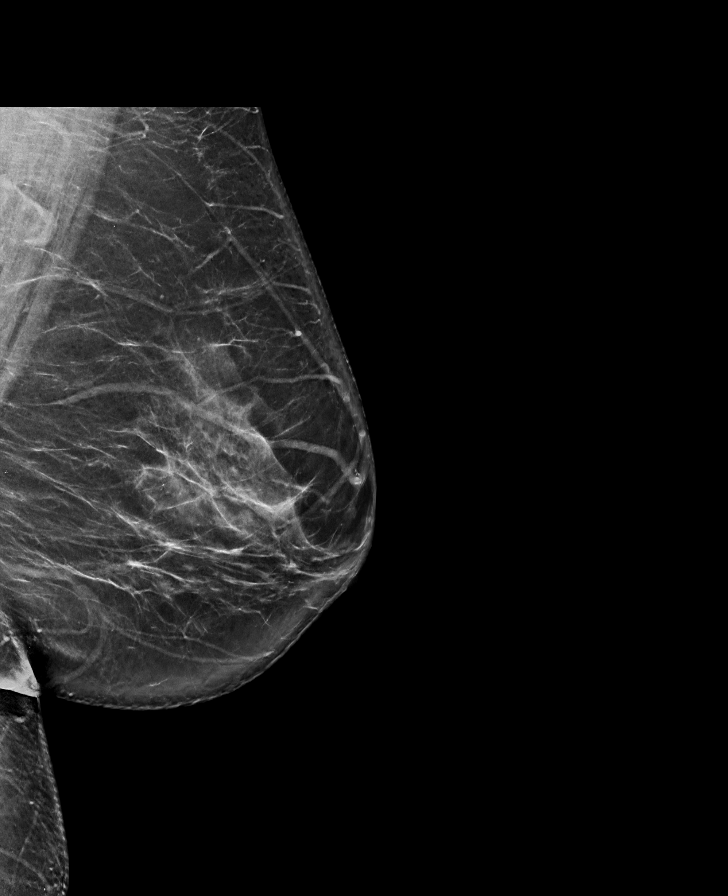

[R CC synth-2D]
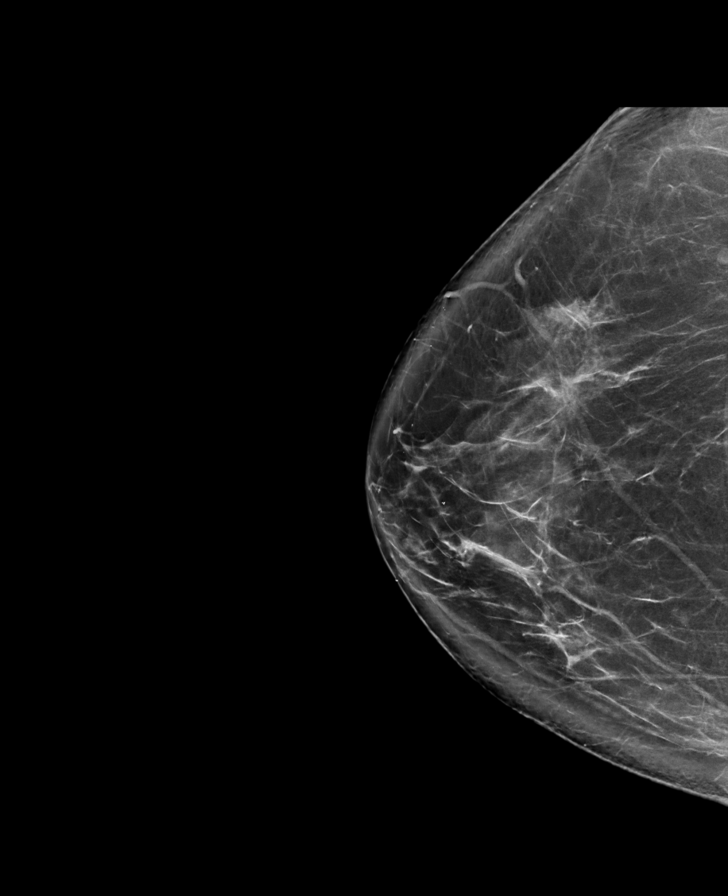

[R MLO tomo · tomo slice 41/81.0]
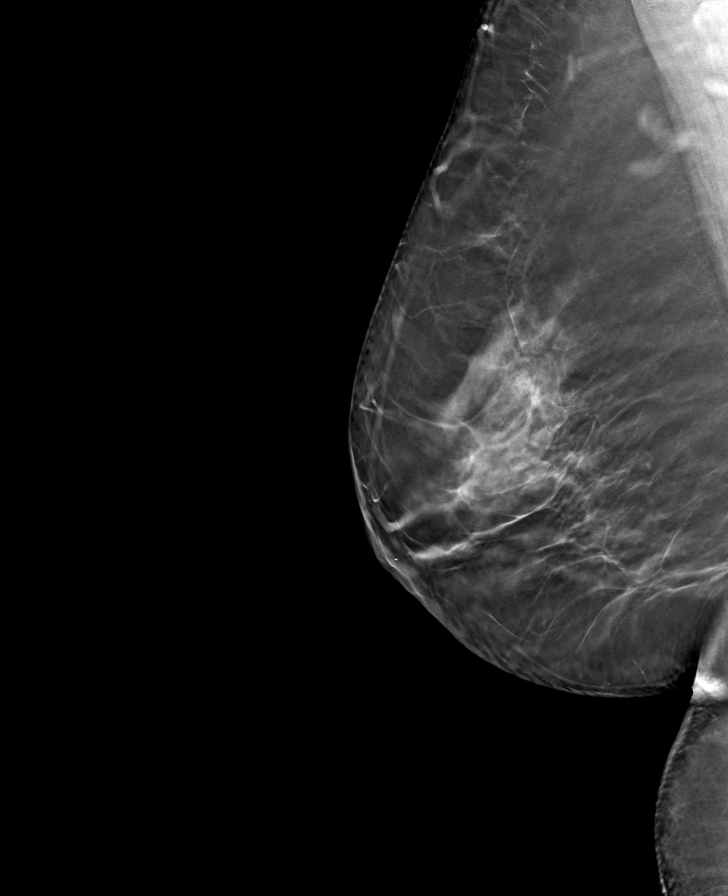

[L MLO tomo · tomo slice 41/82.0]
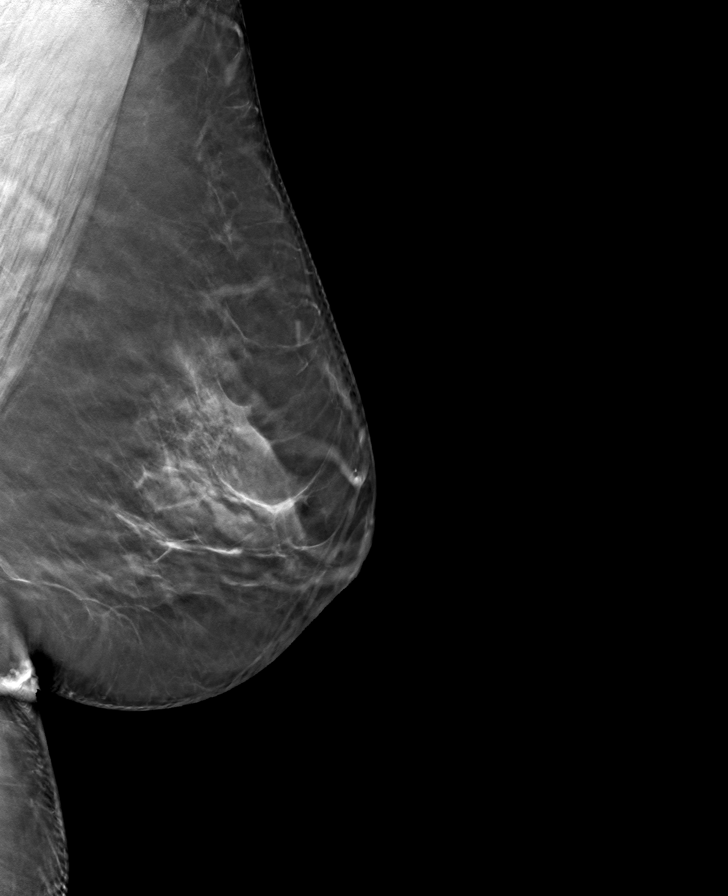

[R CC tomo · tomo slice 45/89.0]
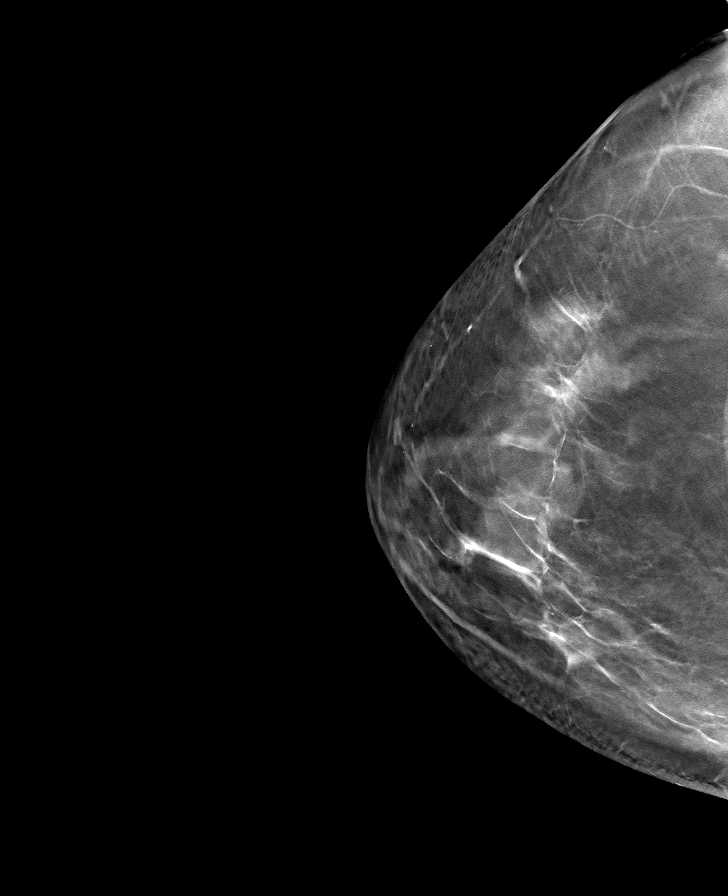

[L CC tomo · tomo slice 43/84.0]
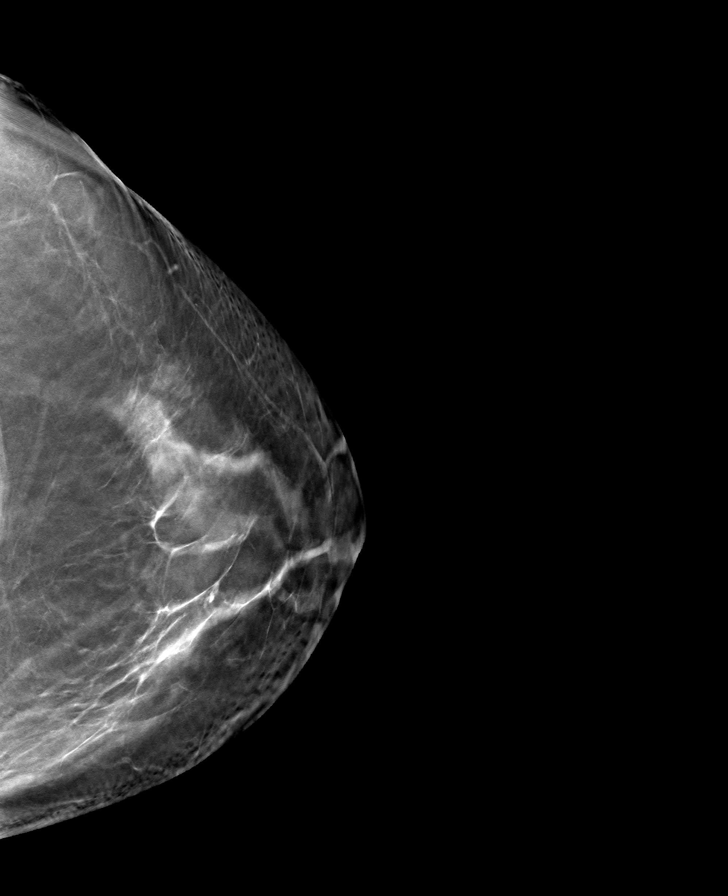

[8 of 24 positions shown; findings below may reference images not displayed]

ACR Breast Density Category b: There are scattered areas of
fibroglandular density.
FINDINGS: There are no findings suspicious for malignancy.
IMPRESSION: No mammographic evidence of malignancy. A result letter of this
screening mammogram will be mailed directly to the patient.

RECOMMENDATION:
Screening mammogram in one year. (Code:51-O-LD2)

BI-RADS CATEGORY  1: Negative.

## 2024-04-04 ENCOUNTER — Telehealth (HOSPITAL_COMMUNITY): Payer: Self-pay | Admitting: Licensed Clinical Social Worker

## 2024-04-04 NOTE — Telephone Encounter (Signed)
 The therapist receives a call from Ms. Therisa Bohr with [facility] saying that this patient is stepping down from their PHP and wants [facility] IOP. She is scheduled for an assessment on 04/11/24 as she discharges on 04/08/24.  Zell Maier, MA, LCSW, [facility], LCAS 04/04/2024

## 2024-04-11 ENCOUNTER — Encounter (HOSPITAL_COMMUNITY): Payer: Self-pay

## 2024-04-11 ENCOUNTER — Ambulatory Visit (INDEPENDENT_AMBULATORY_CARE_PROVIDER_SITE_OTHER): Payer: Self-pay

## 2024-04-11 VITALS — BP 129/91 | HR 82 | Temp 97.8°F | Ht 61.0 in | Wt 214.6 lb

## 2024-04-11 DIAGNOSIS — F1020 Alcohol dependence, uncomplicated: Secondary | ICD-10-CM

## 2024-04-11 DIAGNOSIS — F1421 Cocaine dependence, in remission: Secondary | ICD-10-CM

## 2024-04-11 DIAGNOSIS — F330 Major depressive disorder, recurrent, mild: Secondary | ICD-10-CM

## 2024-04-11 DIAGNOSIS — F419 Anxiety disorder, unspecified: Secondary | ICD-10-CM
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
## 2024-04-13 ENCOUNTER — Other Ambulatory Visit: Payer: Self-pay | Admitting: Physician Assistant

## 2024-04-13 ENCOUNTER — Ambulatory Visit (INDEPENDENT_AMBULATORY_CARE_PROVIDER_SITE_OTHER): Admitting: Licensed Clinical Social Worker

## 2024-04-13 VITALS — BP 129/91 | HR 82 | Ht 61.0 in | Wt 214.6 lb

## 2024-04-13 DIAGNOSIS — F4312 Post-traumatic stress disorder, chronic: Secondary | ICD-10-CM | POA: Diagnosis not present

## 2024-04-13 DIAGNOSIS — F1421 Cocaine dependence, in remission: Secondary | ICD-10-CM | POA: Diagnosis not present

## 2024-04-13 DIAGNOSIS — T7491XS Unspecified adult maltreatment, confirmed, sequela: Secondary | ICD-10-CM

## 2024-04-13 DIAGNOSIS — Z811 Family history of alcohol abuse and dependence: Secondary | ICD-10-CM

## 2024-04-13 DIAGNOSIS — F32A Depression, unspecified: Secondary | ICD-10-CM | POA: Diagnosis not present

## 2024-04-13 DIAGNOSIS — E66813 Obesity, class 3: Secondary | ICD-10-CM

## 2024-04-13 DIAGNOSIS — R7303 Prediabetes: Secondary | ICD-10-CM

## 2024-04-13 DIAGNOSIS — F330 Major depressive disorder, recurrent, mild: Secondary | ICD-10-CM

## 2024-04-13 DIAGNOSIS — F4321 Adjustment disorder with depressed mood: Secondary | ICD-10-CM

## 2024-04-13 DIAGNOSIS — Z8489 Family history of other specified conditions: Secondary | ICD-10-CM

## 2024-04-13 DIAGNOSIS — Z6841 Body Mass Index (BMI) 40.0 and over, adult: Secondary | ICD-10-CM

## 2024-04-13 DIAGNOSIS — M25512 Pain in left shoulder: Secondary | ICD-10-CM

## 2024-04-13 DIAGNOSIS — F5089 Other specified eating disorder: Secondary | ICD-10-CM | POA: Diagnosis not present

## 2024-04-13 DIAGNOSIS — F419 Anxiety disorder, unspecified: Secondary | ICD-10-CM

## 2024-04-13 DIAGNOSIS — Z1231 Encounter for screening mammogram for malignant neoplasm of breast: Secondary | ICD-10-CM

## 2024-04-13 DIAGNOSIS — I10 Essential (primary) hypertension: Secondary | ICD-10-CM

## 2024-04-13 DIAGNOSIS — F1020 Alcohol dependence, uncomplicated: Secondary | ICD-10-CM | POA: Diagnosis not present
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
## 2024-04-15 ENCOUNTER — Ambulatory Visit (INDEPENDENT_AMBULATORY_CARE_PROVIDER_SITE_OTHER): Admitting: Licensed Clinical Social Worker

## 2024-04-15 DIAGNOSIS — F1020 Alcohol dependence, uncomplicated: Secondary | ICD-10-CM | POA: Diagnosis not present

## 2024-04-15 DIAGNOSIS — F1421 Cocaine dependence, in remission: Secondary | ICD-10-CM

## 2024-04-15 DIAGNOSIS — F419 Anxiety disorder, unspecified: Secondary | ICD-10-CM

## 2024-04-15 DIAGNOSIS — F330 Major depressive disorder, recurrent, mild: Secondary | ICD-10-CM

## 2024-04-15 NOTE — Progress Notes (Signed)
 Daily Group Progress Note   Program: CD IOP     Group Time: 9 a.m. to 12 p.m.      Type of Therapy: Process and Psychoeducational    Topic: The therapists check in with group members, assess for SI/HI/psychosis and overall level of functioning. The therapists inquire about sobriety date and number of community support meetings attended since last session.   The therapist shows a video on the stages of relapse indicating the importance of self-care so as to avoid emotional relapse. The therapist discusses CBS and specific cognitive distortions particularly related to addiction.  The therapist talks about avoiding people, places, and things and the particular challenge when it comes to addicted family and/or friends.    Summary: Maansi presents today rating her depression as a 2 and her anxiety as a 4. She describes her mood as hopeful but a little anxious.  Rosali tells her story saying that her drinking became particularly bad over the past 10 years getting worse after her brother died of bladder cancer last 2024-11-13.  Jamaria admits to admits that she has issues with people pleasing and difficulty setting boundaries with family and work which then leads to feeling overwhelmed as a result of not being able to say no.  In talking about cognitive distortions, she says that she had used negative labeling towards herself in the past. Towards the end of the session, she discusses questions about what to do about a friend she has had for 55 years who has problem drinking admitting that ending this relationship is like severing an arm.  She receives feedback from this therapist and group members about this.    Progress Towards Goals: Rosiland reports no change in her sobriety date.   UDS collected: No Results: No   AA/NA attended?:  Yes   Sponsor?:  Yes   [name], MA, LCSW, [facility], LCAS Darice Simpler, MS, LMFT, LCAS 04/15/2024

## 2024-04-18 ENCOUNTER — Encounter (HOSPITAL_COMMUNITY): Payer: Self-pay | Admitting: Licensed Clinical Social Worker

## 2024-04-18 ENCOUNTER — Other Ambulatory Visit (HOSPITAL_COMMUNITY): Payer: Self-pay | Admitting: Medical

## 2024-04-18 ENCOUNTER — Ambulatory Visit (INDEPENDENT_AMBULATORY_CARE_PROVIDER_SITE_OTHER): Admitting: Licensed Clinical Social Worker

## 2024-04-18 DIAGNOSIS — F1020 Alcohol dependence, uncomplicated: Secondary | ICD-10-CM

## 2024-04-18 DIAGNOSIS — F419 Anxiety disorder, unspecified: Secondary | ICD-10-CM

## 2024-04-18 DIAGNOSIS — F1421 Cocaine dependence, in remission: Secondary | ICD-10-CM

## 2024-04-18 DIAGNOSIS — F330 Major depressive disorder, recurrent, mild: Secondary | ICD-10-CM

## 2024-04-18 MED ORDER — BUPROPION HCL ER (SR) 100 MG PO TB12: 100.0000 mg | ORAL_TABLET | Freq: Two times a day (BID) | ORAL | 2 refills | Status: AC

## 2024-04-18 NOTE — Addendum Note (Signed)
 Addended by: LEILA CARLIN BRAVO on: 04/18/2024 05:34 PM   Modules accepted: Level of Service
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
## 2024-04-18 NOTE — Progress Notes (Signed)
 Daily Group Progress Note   Program: CD IOP     Group Time: 9 a.m. to 12 p.m.      Type of Therapy: Process and Psychoeducational    Topic: The therapists check in with group members, assess for SI/HI/psychosis and overall level of functioning. The therapists inquire about sobriety date and number of community support meetings attended since last session.   The therapists discuss how punctuality and honesty connect to recovery showing a short video on recovery and honesty and eliciting feedback from group members about it. The therapists discuss how avoiding triggers for using is especially important for persons in early recovery and the fact that persons in recovery tend to trust their brains too much in terms of what they can handle. The therapists talk about the AA saying regarding not comparing one's insiders to other people's outsides and how to overcome social anxiety related to meeting attendance. The therapists show group members a video on how to do a friendventory and have group members start to share the results of their own. Lastly, the therapists answer questions concerns the use of light therapy for SAD.    Summary: Hannah Chapman presents rating both her anxiety and depression as a 3. She describes her mood as drained but hopeful. She indicates that she is drained as she has not been sleeping well and tends to feel this way in the winter months. She is talking 50 mg of Trazodone. She is encouraged to talk with the PA-C about her sleep and light therapy is discussed.   Hannah Chapman shares her story with a new group member saying that her drinking got out-of-control over the past 10 years and that she was drinking a 5th per day.   She says that she ran into a friend of the past 6 years this past Saturday and was excited; however, this friend was very depressed so brought Hannah Chapman down. She says that the friend is treated for bipolar disorder and has a history of going off her medication. She  told the friend that she can call her for anything but admits that she hopes she does not saying that she has enough to focus with in terms of her own recovery and is trying to be selfish in her recovery. Hannah Chapman receives praise from another group member who informs her that she wishes that she could set limits with people and focus more on herself.   At the conclusion of group, Hannah Chapman indicates that the information on navigating interpersonal relationships was helpful.    Progress Towards Goals: Hannah Chapman reports no change in her sobriety date.    UDS collected: Yes Results: No   AA/NA attended?:  Yes   Sponsor?:  Yes   [name], MA, LCSW, [facility], LCAS Darice Simpler, MS, LMFT, LCAS 04/18/2024

## 2024-04-19 ENCOUNTER — Inpatient Hospital Stay: Admission: RE | Admit: 2024-04-19 | Discharge: 2024-04-19 | Payer: Self-pay | Attending: Physician Assistant

## 2024-04-19 DIAGNOSIS — Z1231 Encounter for screening mammogram for malignant neoplasm of breast: Secondary | ICD-10-CM

## 2024-04-20 ENCOUNTER — Ambulatory Visit (INDEPENDENT_AMBULATORY_CARE_PROVIDER_SITE_OTHER): Admitting: Licensed Clinical Social Worker

## 2024-04-20 DIAGNOSIS — F330 Major depressive disorder, recurrent, mild: Secondary | ICD-10-CM

## 2024-04-20 DIAGNOSIS — F419 Anxiety disorder, unspecified: Secondary | ICD-10-CM

## 2024-04-20 DIAGNOSIS — F1020 Alcohol dependence, uncomplicated: Secondary | ICD-10-CM | POA: Diagnosis not present

## 2024-04-20 DIAGNOSIS — F1421 Cocaine dependence, in remission: Secondary | ICD-10-CM

## 2024-04-20 NOTE — Progress Notes (Signed)
 Daily Group Progress Note   Program: CD IOP     Group Time: 9 a.m. to 12 p.m.      Type of Therapy: Process and Psychoeducational    Topic: The therapists check in with group members, assess for SI/HI/psychosis and overall level of functioning. The therapists inquire about sobriety date and number of community support meetings attended since last session.   The therapists have group members discuss the results of their friendventories and provide feedback to them. The therapists stress the importance of avoiding or minimizing contact with people, places, and things associated with using reiterating that persons in early recovery tend to overestimate what they can handle safely so erring on the side of caution is the most prudent course of action. The therapists begin having group members watch the video, Breaking the Addiction Cycle having them complete the first exercise in the video concerning family attitudes towards alcohol  and to drugs and what they considered an alcohol  or addict to be.    Summary: Hannah Chapman presents today rating her depression and anxiety both as a 3. She says that her husband, children, and mom are her supports; however, her best friend who still drinks is an issue. She says that she is navigating this by only taking her calls during the daytime when the friend is not impaired and that she wants to keep her as a friend.  Hannah Chapman shares her beliefs about what an alcoholic and addict looked like in her mind picturing the same stereotypes as other group members. She says that alcohol  was accepted in her family as part of celebrating positive life events; however, drugs were considered completely taboo.   Progress Towards Goals: Hannah Chapman reports no change in her sobriety date.    UDS collected: No Results: No   AA/NA attended?:  Yes   Sponsor?:  Yes   [name], MA, LCSW, [facility], LCAS Darice Simpler, MS, LMFT, LCAS 04/20/2024

## 2024-04-22 ENCOUNTER — Ambulatory Visit (INDEPENDENT_AMBULATORY_CARE_PROVIDER_SITE_OTHER): Admitting: Licensed Clinical Social Worker

## 2024-04-22 DIAGNOSIS — F1421 Cocaine dependence, in remission: Secondary | ICD-10-CM | POA: Diagnosis not present

## 2024-04-22 DIAGNOSIS — F1020 Alcohol dependence, uncomplicated: Secondary | ICD-10-CM

## 2024-04-22 DIAGNOSIS — F419 Anxiety disorder, unspecified: Secondary | ICD-10-CM | POA: Diagnosis not present

## 2024-04-22 DIAGNOSIS — F330 Major depressive disorder, recurrent, mild: Secondary | ICD-10-CM | POA: Diagnosis not present

## 2024-04-22 NOTE — Progress Notes (Signed)
 Daily Group Progress Note   Program: CD IOP     Group Time: 9 a.m. to 12 p.m.      Type of Therapy: Process and Psychoeducational    Topic: The therapists check in with group members, assess for SI/HI/psychosis and overall level of functioning. The therapists inquire about sobriety date and number of community support meetings attended since last session.   The therapists continue to show the video on Breaking the Addiction Cycle having group members complete the 2nd exercise on the video by providing their favorite rational for I can't be an alcoholic or an addict because ____. The therapists explain that the three biggest misconceptions that cause people to not see that they are an addict or an alcoholic are the belief that in order to meet this criteria that one must experience substance-related withdrawal, that one must be totally non-functional as a result of his or her substance use, and that one must use out-of-control every time he or she uses and be incapable of ever using a small amount and then stopping.    Summary: Hannah Chapman presents rating her depression and anxiety both as a 4.    She says that she was in denial about her drinking for a long time and concluded that as she could go to work and take care of her kids that she did not have a problem.  Hannah Chapman describes her mood as happy and thankful. She informs group members that today is her birthday. She is going to celebrate by going to dinner with her kids.  She says that she has attended meetings but plans on changing from her temporary Sponsor noting that she needs a Sponsor who will give her a push.    Progress Towards Goals: [name] reports no change in her sobriety date.    UDS collected: No Results: Yes, negative for drugs and alcohol  other than nicotine   AA/NA attended?:  Yes   Sponsor?:  Yes   [name], MA, LCSW, [facility], LCAS Darice Simpler, MS, LMFT, LCAS 04/22/2024

## 2024-04-25 ENCOUNTER — Ambulatory Visit (INDEPENDENT_AMBULATORY_CARE_PROVIDER_SITE_OTHER): Admitting: Medical

## 2024-04-25 ENCOUNTER — Encounter (HOSPITAL_COMMUNITY): Payer: Self-pay | Admitting: Medical

## 2024-04-25 DIAGNOSIS — F330 Major depressive disorder, recurrent, mild: Secondary | ICD-10-CM

## 2024-04-25 DIAGNOSIS — Z8489 Family history of other specified conditions: Secondary | ICD-10-CM

## 2024-04-25 DIAGNOSIS — T7491XS Unspecified adult maltreatment, confirmed, sequela: Secondary | ICD-10-CM

## 2024-04-25 DIAGNOSIS — F1020 Alcohol dependence, uncomplicated: Secondary | ICD-10-CM | POA: Diagnosis not present

## 2024-04-25 DIAGNOSIS — F4312 Post-traumatic stress disorder, chronic: Secondary | ICD-10-CM

## 2024-04-25 DIAGNOSIS — F5089 Other specified eating disorder: Secondary | ICD-10-CM

## 2024-04-25 DIAGNOSIS — F1421 Cocaine dependence, in remission: Secondary | ICD-10-CM

## 2024-04-25 DIAGNOSIS — F419 Anxiety disorder, unspecified: Secondary | ICD-10-CM

## 2024-04-25 DIAGNOSIS — F4321 Adjustment disorder with depressed mood: Secondary | ICD-10-CM

## 2024-04-25 DIAGNOSIS — F32A Depression, unspecified: Secondary | ICD-10-CM

## 2024-04-25 DIAGNOSIS — Z811 Family history of alcohol abuse and dependence: Secondary | ICD-10-CM
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
## 2024-04-27 ENCOUNTER — Ambulatory Visit (INDEPENDENT_AMBULATORY_CARE_PROVIDER_SITE_OTHER): Admitting: Licensed Clinical Social Worker

## 2024-04-27 DIAGNOSIS — F419 Anxiety disorder, unspecified: Secondary | ICD-10-CM

## 2024-04-27 DIAGNOSIS — F1020 Alcohol dependence, uncomplicated: Secondary | ICD-10-CM

## 2024-04-27 DIAGNOSIS — F32A Depression, unspecified: Secondary | ICD-10-CM

## 2024-04-27 DIAGNOSIS — F1421 Cocaine dependence, in remission: Secondary | ICD-10-CM

## 2024-04-27 DIAGNOSIS — F330 Major depressive disorder, recurrent, mild: Secondary | ICD-10-CM

## 2024-04-27 MED ORDER — LAMOTRIGINE 25 MG PO TBDP: ORAL_TABLET | ORAL | 0 refills | Status: AC

## 2024-04-27 NOTE — Progress Notes (Signed)
 Daily Group Progress Note   Program: CD IOP     Group Time: 9 a.m. to 12 p.m.      Type of Therapy: Process and Psychoeducational    Topic: The therapist checks in with group members, assesses for SI/HI/psychosis and overall level of functioning. The therapist inquires about sobriety date and number of community support meetings attended since last session.   The therapist spends a great deal of time answering group members' questions concerning how addiction is a disease. The therapist continues having group members watch the video, Breaking the Addiction Cycle having them complete and share the exercise involving unmanageable. The therapist explains that recovery from addiction involves changing one's life and not just stopping the substance. The therapist continues to explain the reason that [place]  sober does not work and why any controlled substances should be avoiding if possible and viewed as potentially dangerous. The therapist discusses motivational interviewing as a way to address denial versus storming one's defenses.    Summary: Hannah Chapman presents rating her depression as a 5 and er anxiety as a 6. She is unable to identify what it was specifically that caused her to see the unmanageability of her drinking but says that when her husband came home the day she made the decision that she told him that she needed help. Her husband later disclosed to her that he had made the decision to leave her if he came home and found her drunk and unwilling to make any changes.  Hannah Chapman asks about the inheritability of addiction questioning how she could have it while her siblings do not with the therapist providing the rational for this. She is very engaged in today's group discussions. Hannah Chapman recognizes that early in her disease that she had the misperception that external people and events caused her to drink and her disease told her that she deserved to drink.   She continues to attend meetings  but has yet to get a new Sponsor and is encouraged to do so with the therapist informing her that given how early she is in recovery that it is important that she has someone with whom she is checking in daily.    Progress Towards Goals: Hannah Chapman reports no change in her sobriety date.    UDS collected: No Results: No   AA/NA attended?:  Yes   Sponsor?:  Yes   [name], MA, LCSW, [facility], LCAS 04/27/2024

## 2024-04-27 NOTE — Addendum Note (Signed)
 Addended by: LEILA CARLIN BRAVO on: 04/27/2024 03:27 PM   Modules accepted: Orders

## 2024-04-29 ENCOUNTER — Ambulatory Visit (INDEPENDENT_AMBULATORY_CARE_PROVIDER_SITE_OTHER)

## 2024-04-29 DIAGNOSIS — F1020 Alcohol dependence, uncomplicated: Secondary | ICD-10-CM | POA: Diagnosis not present

## 2024-04-29 DIAGNOSIS — F419 Anxiety disorder, unspecified: Secondary | ICD-10-CM

## 2024-04-29 DIAGNOSIS — F1421 Cocaine dependence, in remission: Secondary | ICD-10-CM

## 2024-04-29 DIAGNOSIS — F330 Major depressive disorder, recurrent, mild: Secondary | ICD-10-CM

## 2024-04-29 NOTE — Progress Notes (Addendum)
 Daily Group Progress Note   Program: CD IOP     Group Time: 9 a.m. to 12 p.m.      Type of Therapy: Process and Psychoeducational    Topic: The therapist checks in with group members, assesses for SI/HI/psychosis and overall level of functioning. The therapist inquires about sobriety date and number of community support meetings attended since last session.   The therapist introduces the new group member. The therapist discusses the stages of relapse including the emotional, mental and physical. The therapist shows the second half of the [facility] video on the The Disease of Addiction which covers the preoccupation, compulsivity and despair components and facilitated discussion on these topics.  Summary: Hannah Chapman presents rating her  depression as a 5 and her anxiety as a 6. She reports the same sobriety date. She is attending meeting and has a sponsor but she has identified another sponsor she wants to work with and plans to ask this woman to be her sponsor.  Hannah Chapman identifies her feelings as anxious and frustrated. She says that her short term disability continues to ask her for paperwork she has already sent. This therapist explains to her that this agency sent us  the same statement and we faxed back that we had already sent said paperwork.  She reports she plans to call and talk to someone there this afternoon.  Hannah Chapman responds to a peer sharing about a slip as it being a cautionary measure to her and her peers on how easy it is to slip when in emotional relapse.  Hannah Chapman shares her story with the new group member.Hannah Chapman shares that a veterinary surgeon at [employer] asked her if she had gotten rid of her shot glasses. Hannah Chapman says she just put them out of sign. Therapist suggest if she really comes to the understanding the addiction is a brain disease, she may rid herself of the shot glasses. She says she still has thoughts that maybe one day she would be able to drink normally.  Progress  Towards Goals:  Hannah Chapman reports the same sobriety date.  UDS collected: Yes Results: Negative   AA/NA attended?:  Yes   Sponsor?:  Yes   Darice Simpler, MS, LMFT, LCAS 04/29/2024

## 2024-05-02 ENCOUNTER — Ambulatory Visit (HOSPITAL_COMMUNITY)

## 2024-05-02 ENCOUNTER — Telehealth (HOSPITAL_COMMUNITY): Payer: Self-pay | Admitting: Licensed Clinical Social Worker

## 2024-05-02 ENCOUNTER — Telehealth (HOSPITAL_COMMUNITY): Payer: Self-pay

## 2024-05-02 NOTE — Telephone Encounter (Signed)
 The therapist receives a return call from [place] confirming her identity via two identifiers and informs her that he resent the [employer] forms to them today.   Zell Maier, MA, LCSW, [facility], LCAS 05/02/2024

## 2024-05-02 NOTE — Telephone Encounter (Signed)
 The therapist leaves a HIPAA-compliant voice for Hannah Chapman letting her know that the therapists will refax the requested forms.  Zell Maier, MA, LCSW, [facility], LCAS 05/02/2024

## 2024-05-02 NOTE — Telephone Encounter (Signed)
 Hannah Chapman calls this a.m. and says she is not feeling well and will not be in today for CD UIO   Darice Simpler

## 2024-05-02 NOTE — Telephone Encounter (Signed)
 The therapist refaxes Hannah Chapman's [employer] Provider Statement that [name], LMFT, LCAS originally faxed on 04/18/24 as [employer] informed Hannah Chapman that the forms that they received were blank.  Zell Maier, MA, LCSW, [facility], LCAS 05/02/2024

## 2024-05-04 ENCOUNTER — Other Ambulatory Visit (HOSPITAL_COMMUNITY): Payer: Self-pay | Admitting: Medical

## 2024-05-04 ENCOUNTER — Ambulatory Visit (INDEPENDENT_AMBULATORY_CARE_PROVIDER_SITE_OTHER)

## 2024-05-04 DIAGNOSIS — F419 Anxiety disorder, unspecified: Secondary | ICD-10-CM

## 2024-05-04 DIAGNOSIS — F330 Major depressive disorder, recurrent, mild: Secondary | ICD-10-CM

## 2024-05-04 DIAGNOSIS — F1020 Alcohol dependence, uncomplicated: Secondary | ICD-10-CM

## 2024-05-04 DIAGNOSIS — F1421 Cocaine dependence, in remission: Secondary | ICD-10-CM

## 2024-05-04 MED ORDER — TRAZODONE HCL 150 MG PO TABS: 150.0000 mg | ORAL_TABLET | Freq: Every evening | ORAL | 0 refills | Status: AC | PRN

## 2024-05-04 NOTE — Progress Notes (Addendum)
 Daily Group Progress Note   Program: CD IOP     Group Time: 9 a.m. to 12 p.m.      Type of Therapy: Process and Psychoeducational    Topic: The therapists checks in with group members, assesses for SI/HI/psychosis and overall level of functioning. The therapist inquires about sobriety date and number of community support meetings attended since last session.   The therapists introduces the new group member. In response to the group asking learning new information affects their sobriety, this therapist discusses the process of building new neuronal pathways in the brain. Therapist discusses how anxiety is related to substance use, integrating learning about PAWS, grounding techniques to lessen anxiety that brings the focus from the future to the present where irrational thoughts can be challenged and new behavior adapted in their routine.  Summary: Hannah Chapman presents rating her depression as a 5 and her anxiety as a 7.    Hannah Chapman reports having the same sobriety date.  She is attending meetings and now has a permanent sponsor.  She previously had a temporary sponsor.  Hannah Chapman tells her story to the new member noting that last year she lost her brother to death which put her in a total spin. Hannah Chapman identifies her emotions as tired and anxious. She elaborates that she had not been feeling well and she feels anxious about her work vacillating between telling her they have her short term disability paperwork and telling her they have not received it.       Progress Towards Goals:  Hannah Chapman reports the same sobriety date.  UDS collected: No Results: None   AA/NA attended?:  Yes   Sponsor?:  Yes   Darice Simpler, MS, LMFT, LCAS [address], [place], [place], [facility], LCAS 05/04/2024

## 2024-05-06 ENCOUNTER — Ambulatory Visit (INDEPENDENT_AMBULATORY_CARE_PROVIDER_SITE_OTHER): Admitting: Licensed Clinical Social Worker

## 2024-05-06 DIAGNOSIS — F1421 Cocaine dependence, in remission: Secondary | ICD-10-CM

## 2024-05-06 DIAGNOSIS — F1020 Alcohol dependence, uncomplicated: Secondary | ICD-10-CM

## 2024-05-06 DIAGNOSIS — F330 Major depressive disorder, recurrent, mild: Secondary | ICD-10-CM

## 2024-05-06 DIAGNOSIS — F419 Anxiety disorder, unspecified: Secondary | ICD-10-CM
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
## 2024-05-09 ENCOUNTER — Ambulatory Visit (HOSPITAL_COMMUNITY): Admitting: Licensed Clinical Social Worker

## 2024-05-09 DIAGNOSIS — F419 Anxiety disorder, unspecified: Secondary | ICD-10-CM

## 2024-05-09 DIAGNOSIS — F1020 Alcohol dependence, uncomplicated: Secondary | ICD-10-CM

## 2024-05-09 DIAGNOSIS — F330 Major depressive disorder, recurrent, mild: Secondary | ICD-10-CM

## 2024-05-09 DIAGNOSIS — F1421 Cocaine dependence, in remission: Secondary | ICD-10-CM

## 2024-05-09 DIAGNOSIS — F32A Depression, unspecified: Secondary | ICD-10-CM

## 2024-05-09 NOTE — Progress Notes (Signed)
" ° °  Daily Group Progress Note   Program: CD IOP     Group Time: 9 a.m. to 12 p.m.    Virtual Visit via Video Note   ?   I connected with Hannah Chapman at 9 a.m. EST?by a video enabled telemedicine application and verified that I am speaking with the correct person using two identifiers.   Location:   Patient:?home   Provider:?[place], [place]  ?   I discussed the limitations of evaluation and management by telemedicine and the availability of in person appointments. The patient expressed understanding and agreed to proceed.   Type of Therapy: Process and Psychoeducational    Topic: The therapists check in with group members, assess for SI/HI/psychosis and overall level of functioning. The therapists inquire about sobriety date and number of community support meetings attended since last session.   The therapists talk about PAWS and way to reduce the severity and intensity of it and the fact that PAWS can lead to feelings of boredom which in turn can be a trigger for relapse. The therapists discuss the reason that the steps are worked in the order that they are and the need for persons in recovery to be patient and to remember easy does it. The therapists begin to introduce group members to the stages or recovery pointing out that many people in early recovery assume they are running a sprint when in reality, they are running a marathon. The therapists remind group members that if they focus on not using one day at a time that things will start to improve from that one act alone.    Summary: Hannah Chapman presents today rating her depression and anxiety both as a 5.  She describes her mood as tired and frustrated.  She says that she is frustrated as she started the 2 medications prescribed to her by the PA-C and has experienced some side effects 1 of which she has nausea.  He says that she plans on hopefully meeting with him on Wednesday to address these concerns.  Hannah Chapman attended a meeting on  Friday and is working with her new sponsor.  She actively participates in all group discussion today talking about how everyone in recovery wants to hurry up and get past all of the guilt and shame that they are feeling.  She admits that it makes sense that the 12 steps have people wait until they are much further along in the process to try and make amends to people as she says that if she were to try and make amends this early in recovery that it would end up sounding false.  At the conclusion of group, Hannah Chapman says that what she got out of group today was a better understanding of the steps and the realization that recovery is a longer-term process than most people expected to be.    Progress Towards Goals: Hannah Chapman reports no change in her sobriety date.    UDS collected: No   Results: No   AA/NA attended?:  Yes   Sponsor?:  Yes   Hannah Maier, MA, LCSW, [facility], LCAS Hannah Simpler, MS, LMFT, LCAS 05/09/2024    "

## 2024-05-10 ENCOUNTER — Other Ambulatory Visit (HOSPITAL_COMMUNITY): Payer: Self-pay | Admitting: Medical

## 2024-05-11 ENCOUNTER — Ambulatory Visit (HOSPITAL_COMMUNITY)

## 2024-05-13 ENCOUNTER — Other Ambulatory Visit (HOSPITAL_COMMUNITY): Payer: Self-pay | Admitting: Medical

## 2024-05-13 ENCOUNTER — Ambulatory Visit (HOSPITAL_COMMUNITY)

## 2024-05-13 DIAGNOSIS — F1421 Cocaine dependence, in remission: Secondary | ICD-10-CM

## 2024-05-13 DIAGNOSIS — F1020 Alcohol dependence, uncomplicated: Secondary | ICD-10-CM

## 2024-05-13 DIAGNOSIS — F330 Major depressive disorder, recurrent, mild: Secondary | ICD-10-CM

## 2024-05-13 MED ORDER — NALTREXONE HCL 50 MG PO TABS: 50.0000 mg | ORAL_TABLET | Freq: Every day | ORAL | 0 refills | Status: DC

## 2024-05-13 NOTE — Progress Notes (Signed)
 Patient ID: Hannah Chapman, female   DOB: 10-13-1965, 59 y.o.   MRN: [id number]  ORDERS ONLY

## 2024-05-13 NOTE — Progress Notes (Signed)
" ° °  Daily Group Progress Note   Program: CD IOP     Group Time: 9 a.m. to 12 p.m.    Virtual Visit via Video Note   ?   I connected with Sonny at 9 a.m. EST?by a video enabled telemedicine application and verified that I am speaking with the correct person using two identifiers.   Location:   Patient:?home   Provider:?[place], [place]  ?   I discussed the limitations of evaluation and management by telemedicine and the availability of in person appointments. The patient expressed understanding and agreed to proceed.   Type of Therapy: Process and Psychoeducational    Topic: The therapists check in with group members, assess for SI/HI/psychosis and overall level of functioning. The therapists inquire about sobriety date and number of community support meetings attended since last session.   The therapists talk about PAWS and way to reduce the severity and intensity of it and the fact that PAWS can lead to feelings of boredom which in turn can be a trigger for relapse. The therapists discuss the reason that the steps are worked in the order that they are and the need for persons in recovery to be patient and to remember easy does it. The therapists begin to introduce group members to the stages or recovery pointing out that many people in early recovery assume they are running a sprint when in reality, they are running a marathon. The therapists remind group members that if they focus on not using one day at a time that things will start to improve from that one act alone.    Summary: Lark presents today rating her depression as a 4 and her anxiety as a 6. Journiee reports she is attending meetings and has a sponsor. She identified her emotions as tired and  anxious. She says she has not felt well.  Jayleana shares with a peer about boundaries her family set with her while she was in active addiction.  Johanne says that her son would bring his son to visit Yanni and when Maison would  start drinking, her son would take her grandson and leave and tell Holly they were not going to expose her grandson to family members who drink alcohol .  Delesha shares that this is what really got her attention and she knows her son meant well and was kind in how he approached the subject.    Progress Towards Goals: Sumayyah reports no change in her sobriety date.    UDS collected: No   Results: No   AA/NA attended?:  Yes   Sponsor?:  Yes Darice Simpler, MS, LMFT, LCAS [address] [place], [place], [facility], LCAS  05/13/2024    "

## 2024-05-15 ENCOUNTER — Telehealth (HOSPITAL_COMMUNITY): Payer: Self-pay | Admitting: Licensed Clinical Social Worker

## 2024-05-15 NOTE — Telephone Encounter (Signed)
 Note in error.

## 2024-05-16 ENCOUNTER — Ambulatory Visit (INDEPENDENT_AMBULATORY_CARE_PROVIDER_SITE_OTHER): Admitting: Licensed Clinical Social Worker

## 2024-05-16 DIAGNOSIS — F1020 Alcohol dependence, uncomplicated: Secondary | ICD-10-CM | POA: Diagnosis not present

## 2024-05-16 DIAGNOSIS — F330 Major depressive disorder, recurrent, mild: Secondary | ICD-10-CM

## 2024-05-16 DIAGNOSIS — F1421 Cocaine dependence, in remission: Secondary | ICD-10-CM

## 2024-05-16 DIAGNOSIS — F419 Anxiety disorder, unspecified: Secondary | ICD-10-CM

## 2024-05-18 ENCOUNTER — Telehealth (HOSPITAL_COMMUNITY): Payer: Self-pay

## 2024-05-18 ENCOUNTER — Ambulatory Visit (HOSPITAL_COMMUNITY): Admitting: Medical

## 2024-05-18 DIAGNOSIS — F330 Major depressive disorder, recurrent, mild: Secondary | ICD-10-CM

## 2024-05-18 DIAGNOSIS — F1020 Alcohol dependence, uncomplicated: Secondary | ICD-10-CM

## 2024-05-18 DIAGNOSIS — F1421 Cocaine dependence, in remission: Secondary | ICD-10-CM

## 2024-05-18 DIAGNOSIS — F419 Anxiety disorder, unspecified: Secondary | ICD-10-CM

## 2024-05-18 MED ORDER — NALTREXONE HCL 50 MG PO TABS: 50.0000 mg | ORAL_TABLET | Freq: Every day | ORAL | 0 refills | Status: AC

## 2024-05-18 MED ORDER — METFORMIN HCL 500 MG PO TABS: 500.0000 mg | ORAL_TABLET | Freq: Every morning | ORAL | 0 refills | Status: AC

## 2024-05-18 NOTE — Progress Notes (Unsigned)
 Daily Group Progress Note   Program: CD IOP     Group Time: 9 a.m. to 12 p.m.    Type of Therapy: Process and Psychoeducational    Topic: The therapists check in with group members, assess for SI/HI/psychosis and overall level of functioning. The therapists inquire about sobriety date and number of community support meetings attended since last session.   The therapists discuss the fact that emotions are not right or wrong and that people have the right to feel however they feel. The therapists talk about how emotions can be used as helpful guides through life and not there are not good versus bad emotions. The therapists discuss the need for maintaining a healthy work/life balance suggesting that this requires assertiveness and a need to examine the character defects that contribute to workaholism. The therapists talk about using dreams and normalize them. They reframe relapses as being opportunities for learning rather than failures and discuss the importance of family therapy and the fact that it is offered as part of SA IOP.    Summary: Hannah Chapman presents today rating her depression as a 6 and her anxiety as a 5. She describes her mood as irritable and tired.   She has been to a meeting and is working with her Sponsor on Step 2 and Chapter 4. She says that her Sponsor has her sending her feelings words daily.  Hannah Chapman says that her depression is worse wondering if the new medication is causing this or if it is the result of being cooped up in the house from the weather.  She concludes that she has more work to be done I her recovery saying that she needs to address her anxiety as she tends to worry about things and also needs to overcome her social anxiety though recognizes that this has improved from attending recovery groups.   Hannah Chapman says that she endeavors not to catastrophize and often has to remind herself that the things she is worrying about have not happened yet.     Progress  Towards Goals: Hannah Chapman reports no change in her sobriety date.    UDS collected: Yes   Results: No   AA/NA attended?:  Yes   Sponsor?:  Yes   [name], MA, LCSW, [facility], LCAS Darice Simpler, MS, LMFT, LCAS 05/18/2024

## 2024-05-18 NOTE — Progress Notes (Incomplete)
" ° °  [facility] Follow-up Outpatient CDIOP Date:   Admission Date:  Sobriety date:  Subjective:   HPI : CD IOP Provider FU  Counselor's report:  Review of Systems: Psychiatric: Agitation: See Counselor report Hallucination: No Depressed Mood: see Counselor reports Insomnia: No Hypersomnia: No Altered Concentration: No Feels Worthless: No Grandiose Ideas: No Belief In Special Powers: No New/Increased Substance Abuse: No Compulsions: In early withdrawal  Neurologic: Headache: No Seizure: No Paresthesias: No  Current Medications:   Mental Status Examination  Appearance: Alert: Yes Attention: good  Cooperative: Yes Eye Contact: Good Speech: Clear and coherent, rate WNL Psychomotor Activity: Normal Memory: Concentration/Attention: Normal/intact Oriented: person, place, time/date and situation Mood: Euthymic Affect: Appropriate and Congruent Thought Processes and Associations: Coherent and Intact Fund of Knowledge: Good Thought Content: WDL Insight:Limited Judgement:Impaired  UDS:  PDMP:  Diagnosis:   Assessment:  Treatment Plan: Hannah Emmer, PA-CPatient ID: Hannah Chapman, female   DOB: January 10, 1966, 59 y.o.   MRN: [id number]  "

## 2024-05-18 NOTE — Telephone Encounter (Signed)
 This therapist calls Kariya and verifies she is speaking with the correct person by obtained two identifiers.  Therapist explains to [place] that she obtained a confirmation of her authorization for CD IOP so she  not have to be concerned about this issue. Jaden answers the phone and therapist communicates this information. Insiya thanks this therapist for calling and says she will see this therapist in group tomorrow.  Darice Simpler MS, LMFT, LCAS

## 2024-05-20 ENCOUNTER — Ambulatory Visit (HOSPITAL_COMMUNITY)

## 2024-05-20 DIAGNOSIS — F419 Anxiety disorder, unspecified: Secondary | ICD-10-CM

## 2024-05-20 DIAGNOSIS — F1421 Cocaine dependence, in remission: Secondary | ICD-10-CM

## 2024-05-20 DIAGNOSIS — F1020 Alcohol dependence, uncomplicated: Secondary | ICD-10-CM

## 2024-05-20 DIAGNOSIS — F32A Depression, unspecified: Secondary | ICD-10-CM

## 2024-05-20 NOTE — Progress Notes (Signed)
 Daily Group Progress Note   Program: CD IOP     Group Time: 9 a.m. to 12 p.m.    Type of Therapy: Process and Psychoeducational    Topic: The therapists check in with group members, assess for SI/HI/psychosis and overall level of functioning. The therapists inquire about sobriety date and number of community support meetings attended since last session.   The therapists discussed the following subjects:holding individuals responsibility for their own recovery/discussed ways of dealing with empty time by keeping time scheduled so as to keep one busy/discussed and provided examples of making relapse prevention plans for both addiction and mental health issues detailing what behaviors or emotions to note when one first becomes aware of them and then having a corresponding behavior to act on to interrupt the trigger for relapse or decompensation/discussed spirituality as an important part of recovery     Summary: Hannah Chapman presents today rating her depression as a 5 and her anxiety as a 7 She describes her mood as Hopeful and tired. Hannah Chapman reports the same sobriety date. She reports she is attend meetings and has a sponsor and is working on step 2.  Hannah Chapman reports she is not sleeping well. She says that Hannah Chapman increased her Trazodone  but it is not helping. Therapist suggest she try eating a little something with the Trazodone  as it may or may not help the medication to absorb.    Hannah Chapman says she has learned skills that sometimes thinks she could go back and just have one drink so she realizes just learning skills won't save her.  Hannah Chapman joins in on the discussion of spirituality and notes how she feels she has grown spirituality in her recovery. She sheds a few tears.    Progress Towards Goals: Hannah Chapman reports no change in her sobriety date.    UDS collected: Yes   Results: No   AA/NA attended?:  Yes   Sponsor?:  Yes   Hannah Maier, MA, LCSW, [facility], LCAS Hannah Simpler, MS, LMFT,  LCAS 05/20/2024

## 2024-05-23 ENCOUNTER — Ambulatory Visit (HOSPITAL_COMMUNITY)

## 2024-05-25 ENCOUNTER — Ambulatory Visit (HOSPITAL_COMMUNITY)

## 2024-05-27 ENCOUNTER — Ambulatory Visit (HOSPITAL_COMMUNITY)

## 2024-05-30 ENCOUNTER — Ambulatory Visit (HOSPITAL_COMMUNITY)

## 2024-06-01 ENCOUNTER — Ambulatory Visit (HOSPITAL_COMMUNITY)

## 2024-06-03 ENCOUNTER — Ambulatory Visit (HOSPITAL_COMMUNITY)
